# Patient Record
Sex: Female | Born: 1985 | Race: Black or African American | Hispanic: No | Marital: Single | Smoking: Never smoker
Health system: Southern US, Community
[De-identification: ages and names within clinical notes are randomized; demographics above are authoritative.]

## PROBLEM LIST (undated history)

## (undated) DIAGNOSIS — F32A Depression, unspecified: Secondary | ICD-10-CM

## (undated) DIAGNOSIS — R42 Dizziness and giddiness: Secondary | ICD-10-CM

## (undated) DIAGNOSIS — F419 Anxiety disorder, unspecified: Secondary | ICD-10-CM

## (undated) DIAGNOSIS — K219 Gastro-esophageal reflux disease without esophagitis: Secondary | ICD-10-CM

## (undated) DIAGNOSIS — M5430 Sciatica, unspecified side: Secondary | ICD-10-CM

## (undated) DIAGNOSIS — M62838 Other muscle spasm: Secondary | ICD-10-CM

## (undated) HISTORY — DX: Morbid (severe) obesity due to excess calories: E66.01

## (undated) HISTORY — DX: Depression, unspecified: F32.A

---

## 2013-06-28 ENCOUNTER — Ambulatory Visit (INDEPENDENT_AMBULATORY_CARE_PROVIDER_SITE_OTHER): Payer: BC Managed Care – PPO | Admitting: Surgery

## 2013-06-28 ENCOUNTER — Encounter (INDEPENDENT_AMBULATORY_CARE_PROVIDER_SITE_OTHER): Payer: Self-pay | Admitting: Surgery

## 2013-06-28 DIAGNOSIS — E669 Obesity, unspecified: Secondary | ICD-10-CM | POA: Insufficient documentation

## 2013-06-28 NOTE — Progress Notes (Signed)
Chief Complaint:  Morbid obesity BMI is 52  History of Present Illness:  Amanda Ruiz is an 28 y.o. female who was attended our seminar inis interested in a laparoscopic sleeve gastrectomyI reviewed the surgery with the anatomic charts and discussed the complications not limited to bleeding and leaks.    She has tried many diets and attempts at weight loss with limited success.  I think that she is a good candidate for sleeve gastrectomy.  She denies GERD.    Past Medical History  Diagnosis Date  . Morbid obesity     No past surgical history on file.  No current outpatient prescriptions on file.   No current facility-administered medications for this visit.   Review of patient's allergies indicates no known allergies. No family history on file. Social History:   reports that she has never smoked. She does not have any smokeless tobacco history on file. She reports that she drinks alcohol. Her drug history is not on file.   REVIEW OF SYSTEMS : Positive for nothing ; otherwise negative  Physical Exam:   Blood pressure 128/84, pulse 71, temperature 97 F (36.1 C), temperature source Temporal, resp. rate 16, height 5\' 5"  (1.651 m), weight 313 lb 9.6 oz (142.248 kg). Body mass index is 52.19 kg/(m^2).  Gen:  WDWN AAF NAD  Neurological: Alert and oriented to person, place, and time. Motor and sensory function is grossly intact  Head: Normocephalic and atraumatic.  Eyes: Conjunctivae are normal. Pupils are equal, round, and reactive to light. No scleral icterus.  Neck: Normal range of motion. Neck supple. No tracheal deviation or thyromegaly present.  Cardiovascular:  SR without murmurs or gallops.  No carotid bruits Breast:  Not examined Respiratory: Effort normal.  No respiratory distress. No chest wall tenderness. Breath sounds normal.  No wheezes, rales or rhonchi.  Abdomen:  Obese and nontender GU:  Not examined Musculoskeletal: Normal range of motion. Extremities are  nontender. No cyanosis, edema or clubbing noted Lymphadenopathy: No cervical, preauricular, postauricular or axillary adenopathy is present Skin: Skin is warm and dry. No rash noted. No diaphoresis. No erythema. No pallor. Pscyh: Normal mood and affect. Behavior is normal. Judgment and thought content normal.   LABORATORY RESULTS: No results found for this or any previous visit (from the past 48 hour(s)).   RADIOLOGY RESULTS: No results found.  Problem List: There are no active problems to display for this patient.   Assessment & Plan: Morbid obesity  BMI 52 and a good candidate for laparoscopic sleeve gastrectomy    Matt B. Daphine DeutscherMartin, MD, [facility]  [facility] [phone] beeper [phone]  06/28/2013 4:18 PM

## 2013-06-28 NOTE — Addendum Note (Signed)
Addended by: Maryan PulsMOORE, CHRISTY on: 06/28/2013 04:36 PM   Modules accepted: Orders

## 2013-06-28 NOTE — Patient Instructions (Signed)
Sleeve Gastrectomy A sleeve gastrectomy is a surgery in which a large portion of the stomach is removed. After the surgery, the stomach will be a narrow tube about the size of a banana. This surgery is performed to help a person lose weight. The person loses weight because the reduced size of the stomach restricts the amount of food that the person can eat. The stomach will hold much less food than before the surgery. Also, the part of the stomach that is removed produces a hormone that causes hunger.  This surgery is done for people who have morbid obesity, defined as a body mass index (BMI) greater than 40. BMI is an estimate of body fat and is calculated from the height and weight of a person. This surgery may also be done for people with a BMI between 35 and 40 if they have other diseases, such as type 2 diabetes mellitus, obstructive sleep apnea, or heart and lung disorders (cardiopulmonary diseases).  LET YOUR HEALTH CARE PROVIDER KNOW ABOUT:  Any allergies you have.   All medicines you are taking, including vitamins, herbs, eyedrops, creams, and over-the-counter medicines.   Use of steroids (by mouth or creams).   Previous problems you or members of your family have had with the use of anesthetics.   Any blood disorders you have.   Previous surgeries you have had.   Possibility of pregnancy, if this applies.   Other health problems you have. RISKS AND COMPLICATIONS Generally, sleeve gastrectomy is a safe procedure. However, as with any procedure, complications can occur. Possible complications include:  Infection.  Bleeding.  Blood clots.  Damage to other organs or tissue.  Leakage of fluid from the stomach into the abdominal cavity (rare). BEFORE THE PROCEDURE  You may need to have blood tests and imaging tests (such as X-rays or ultrasonography) done before the day of surgery. A test to evaluate your esophagus and how it moves (esophageal manometry) may also be  done.  You may be placed on a liquid diet 2-3 weeks before the surgery.  Ask your health care provider about changing or stopping your regular medicines.  Do not eat or drink anything for at least 8 hours before the procedure.   Make plans to have someone drive you home after your hospital stay. Also arrange for someone to help you with activities during recovery. PROCEDURE  A laparoscopic technique is usually used for this surgery:  You will be given medicine to make you sleep through the procedure (general anesthetic). This medicine will be given through an intravenous (IV) access tube that is put into one of your veins.  Once you are asleep, your abdomen will be cleaned and sterilized.  Several small incisions will be made in your abdomen.  Your abdomen will be filled with air so that it expands. This gives the surgeon more room to operate and makes your organs easier to see.  A thin, lighted tube with a tiny camera on the end (laparoscope) is put through a small incision in your abdomen. The camera on the laparoscope sends a picture to a TV screen in the operating room. This gives the surgeon a good view inside the abdomen.  Hollow tubes are put through the other small incisions in your abdomen. The tools needed for the procedure are put through these tubes.  The surgeon uses staples to divide part of the stomach and then removes it through one of the incisions.  The remaining stomach may be reinforced using stitches   or surgical glue or both to prevent leakage of the stomach contents. A small tube (drain) may be placed through one of the incisions to allow extra fluid to flow from the area.  The incisions are closed with stitches, staples, or glue. AFTER THE PROCEDURE  You will be monitored closely in a recovery area. Once the anesthetic has worn off, you will likely be moved to a regular hospital room.  You will be given medicine for pain and nausea.   You may have a drain  from one of the incisions in your abdomen. If a drain is used, it may stay in place after you go home from the hospital and be removed at a follow-up appointment.   You will be encouraged to walk around several times a day. This helps prevent blood clots.  You will be started on a liquid diet the first day after your surgery. Sometimes a test is done to check for leaking before you can eat.  You will be urged to cough and do deep breathing exercises. This helps prevent a lung infection after a surgery.  You will likely need to stay in the hospital for a few days.  Document Released: 10/17/2008 Document Revised: 08/22/2012 Document Reviewed: 05/04/2012 ExitCare Patient Information 2015 [employer]. This information is not intended to replace advice given to you by your health care provider. Make sure you discuss any questions you have with your health care provider.  

## 2013-07-03 LAB — CBC WITH DIFFERENTIAL/PLATELET
BASOS PCT: 0 % (ref 0–1)
Basophils Absolute: 0 10*3/uL (ref 0.0–0.1)
Eosinophils Absolute: 0.1 10*3/uL (ref 0.0–0.7)
Eosinophils Relative: 1 % (ref 0–5)
HCT: 33.8 % — ABNORMAL LOW (ref 36.0–46.0)
HEMOGLOBIN: 10.8 g/dL — AB (ref 12.0–15.0)
LYMPHS PCT: 36 % (ref 12–46)
Lymphs Abs: 2.1 10*3/uL (ref 0.7–4.0)
MCH: 23.3 pg — ABNORMAL LOW (ref 26.0–34.0)
MCHC: 32 g/dL (ref 30.0–36.0)
MCV: 73 fL — ABNORMAL LOW (ref 78.0–100.0)
Monocytes Absolute: 0.3 10*3/uL (ref 0.1–1.0)
Monocytes Relative: 6 % (ref 3–12)
NEUTROS PCT: 57 % (ref 43–77)
Neutro Abs: 3.2 10*3/uL (ref 1.7–7.7)
Platelets: 287 10*3/uL (ref 150–400)
RBC: 4.63 MIL/uL (ref 3.87–5.11)
RDW: 15.7 % — AB (ref 11.5–15.5)
WBC: 5.7 10*3/uL (ref 4.0–10.5)

## 2013-07-03 LAB — HEMOGLOBIN A1C
Hgb A1c MFr Bld: 6 % — ABNORMAL HIGH (ref <5.7)
Mean Plasma Glucose: 126 mg/dL — ABNORMAL HIGH (ref <117)

## 2013-07-04 LAB — COMPREHENSIVE METABOLIC PANEL
ALK PHOS: 101 U/L (ref 39–117)
ALT: 21 U/L (ref 0–35)
AST: 14 U/L (ref 0–37)
Albumin: 3.8 g/dL (ref 3.5–5.2)
BUN: 11 mg/dL (ref 6–23)
CO2: 23 mEq/L (ref 19–32)
Calcium: 9.2 mg/dL (ref 8.4–10.5)
Chloride: 105 mEq/L (ref 96–112)
Creat: 0.67 mg/dL (ref 0.50–1.10)
Glucose, Bld: 91 mg/dL (ref 70–99)
Potassium: 4.3 mEq/L (ref 3.5–5.3)
SODIUM: 137 meq/L (ref 135–145)
TOTAL PROTEIN: 7.1 g/dL (ref 6.0–8.3)
Total Bilirubin: 0.3 mg/dL (ref 0.2–1.2)

## 2013-07-04 LAB — LIPID PANEL
CHOL/HDL RATIO: 3.3 ratio
Cholesterol: 190 mg/dL (ref 0–200)
HDL: 57 mg/dL (ref >39)
LDL CALC: 116 mg/dL — AB (ref 0–99)
TRIGLYCERIDES: 85 mg/dL (ref <150)
VLDL: 17 mg/dL (ref 0–40)

## 2013-07-04 LAB — T4: T4, Total: 8.8 ug/dL (ref 5.0–12.5)

## 2013-07-04 LAB — TSH: TSH: 2.108 u[IU]/mL (ref 0.350–4.500)

## 2013-07-07 LAB — VITAMIN D 1,25 DIHYDROXY
Vitamin D 1, 25 (OH)2 Total: 43 pg/mL (ref 18–72)
Vitamin D2 1, 25 (OH)2: <8 pg/mL
Vitamin D3 1, 25 (OH)2: 43 pg/mL

## 2013-07-13 ENCOUNTER — Encounter: Payer: BC Managed Care – PPO | Attending: Surgery | Admitting: Dietician

## 2013-07-13 ENCOUNTER — Encounter: Payer: Self-pay | Admitting: Dietician

## 2013-07-13 DIAGNOSIS — Z01818 Encounter for other preprocedural examination: Secondary | ICD-10-CM | POA: Diagnosis not present

## 2013-07-13 DIAGNOSIS — Z713 Dietary counseling and surveillance: Secondary | ICD-10-CM | POA: Diagnosis not present

## 2013-07-13 NOTE — Patient Instructions (Signed)
Follow Pre-Op Goals Try Protein Shakes Call [facility] at [phone] when surgery is scheduled to enroll in Pre-Op Class 

## 2013-07-13 NOTE — Progress Notes (Signed)
  Pre-Op Assessment Visit:  Pre-Operative Sleeve Gastrectomy Surgery  Medical Nutrition Therapy:  Appt start time: 0745   End time:  0815.  Patient was seen on 07/13/2013 for Pre-Operative Sleeve Gastrectomy Nutrition Assessment. [facility] on 07/13/2013.   Preferred Learning Style:   No preference indicated   Learning Readiness:   Ready  Handouts given during visit include:  Pre-Op Goals Bariatric Surgery Protein Shakes  Teaching Method Utilized:  Visual Auditory Hands on  Barriers to learning/adherence to lifestyle change: none  Demonstrated degree of understanding via:  Teach Back   [facility] to [facility] the [facility] to enroll in Pre-Op and Post-Op Nutrition Education when surgery date is scheduled.

## 2013-07-29 ENCOUNTER — Ambulatory Visit (HOSPITAL_COMMUNITY)
Admission: RE | Admit: 2013-07-29 | Discharge: 2013-07-29 | Disposition: A | Discharge status: Home / Self Care | Payer: BC Managed Care – PPO | Source: Ambulatory Visit | Attending: Surgery | Admitting: Surgery

## 2013-07-29 ENCOUNTER — Encounter (INDEPENDENT_AMBULATORY_CARE_PROVIDER_SITE_OTHER): Payer: Self-pay

## 2013-07-29 ENCOUNTER — Encounter (HOSPITAL_COMMUNITY)
Admission: RE | Disposition: A | Discharge status: Home / Self Care | Payer: Self-pay | Source: Ambulatory Visit | Attending: Surgery

## 2013-07-29 ENCOUNTER — Other Ambulatory Visit: Payer: Self-pay

## 2013-07-29 DIAGNOSIS — Z6841 Body Mass Index (BMI) 40.0 and over, adult: Secondary | ICD-10-CM | POA: Insufficient documentation

## 2013-07-29 DIAGNOSIS — Z01818 Encounter for other preprocedural examination: Secondary | ICD-10-CM | POA: Insufficient documentation

## 2013-07-29 HISTORY — PX: BREATH TEK H PYLORI: SHX5422

## 2013-07-29 SURGERY — BREATH TEST, FOR HELICOBACTER PYLORI

## 2013-07-29 NOTE — Progress Notes (Signed)
07/29/13 0817  BREATH TEK ASSESSMENT  Referring MD Luretha MurphyMatthew Martin  Time of Last PO Intake 1900 (on 07/28/2013)  Baseline Breath At: 0752  Pranactin Given At: 0755  Post-Dose Breath At: 0810  Sample 1 2.6  Sample 2 2.5  Test Negative

## 2013-07-30 ENCOUNTER — Encounter (HOSPITAL_COMMUNITY): Payer: Self-pay | Admitting: Surgery

## 2016-04-30 ENCOUNTER — Encounter (HOSPITAL_COMMUNITY): Payer: Self-pay | Admitting: *Deleted

## 2016-04-30 ENCOUNTER — Ambulatory Visit (HOSPITAL_COMMUNITY)
Admission: EM | Admit: 2016-04-30 | Discharge: 2016-04-30 | Disposition: A | Discharge status: Home / Self Care | Payer: BLUE CROSS/BLUE SHIELD | Attending: Family Medicine | Admitting: Family Medicine

## 2016-04-30 DIAGNOSIS — K068 Other specified disorders of gingiva and edentulous alveolar ridge: Secondary | ICD-10-CM | POA: Diagnosis not present

## 2016-04-30 NOTE — Discharge Instructions (Signed)
If your gums start to bleed again, apply direct pressure with gauze.  Try to avoid things like aspirin, ibuprofen, Motrin, Aleve, Advil.  You could consider taking one vitamin K supplement daily. This may be of limited benefit.  Try to avoid physically irritating foods that could cause your gums to bleed again.

## 2016-04-30 NOTE — ED Triage Notes (Signed)
Reports having "deep dental cleaning" performed at dentist office 2 days ago; had some gum bleeding during.  Since then, has had intermittent signficant bleeding on right bottom gum surrounding tooth.

## 2016-04-30 NOTE — ED Provider Notes (Signed)
[facility]    CSN: [id number] Arrival date & time: 04/30/16  1714     History   Chief Complaint Chief Complaint  Patient presents with  . Bleeding/Bruising    HPI Amanda Ruiz is a 31 y.o. female.   HPI  The patient was at her dentist's office 2 days ago and had a deep cleaning. She noticed some bleeding gums over the lower row of teeth after the appointment. It has continued to bleed over the past couple days. It bothers her most at night. She does not have any foul or strange tastes other than blood. No fevers and she is no longer having any pain. She denies any personal or family history of easy bruising or bleeding. She does not take any aspirin or anti-inflammatories routinely. She did take Tylenol for pain.  Past Medical History:  Diagnosis Date  . Morbid obesity [facility])     Patient Active Problem List   Diagnosis Date Noted  . Morbid obesity (HCC) 06/28/2013    Past Surgical History:  Procedure Laterality Date  . BREATH TEK H PYLORI N/A 07/29/2013   Procedure: BREATH TEK H PYLORI;  Surgeon: Valarie Merino, MD;  Location: [facility];  Service: General;  Laterality: N/A;    Home Medications    Prior to Admission medications   Medication Sig Start Date End Date Taking? Authorizing Provider  Levonorgestrel (SKYLA) 13.5 MG IUD by Intrauterine route.   Yes Historical Provider, MD    Family History Family History  Problem Relation Age of Onset  . Hypertension Other     Social History Social History  Substance Use Topics  . Smoking status: Never Smoker  . Alcohol use Yes     Comment: occasionally     Allergies   Patient has no known allergies.   Review of Systems Review of Systems  HENT:       + Bleeding gums     Physical Exam Triage Vital Signs ED Triage Vitals [04/30/16 1741]  Enc Vitals Group     BP 134/68     Pulse Rate (!) 104     Resp 18     Temp 98 F (36.7 C)     Temp Source Oral     SpO2 100 %   Updated Vital  Signs BP 134/68   Pulse (!) 104   Temp 98 F (36.7 C) (Oral)   Resp 18   SpO2 100%   Physical Exam  Constitutional: She is oriented to person, place, and time. She appears well-developed and well-nourished.  HENT:  Head: Normocephalic and atraumatic.  Mouth/Throat: Oropharynx is clear and moist.  Coagulated blood around the root of the 1st premolar of R mandibular row. No swelling, erythema or drainage.  Eyes: Pupils are equal, round, and reactive to light.  Neck: Normal range of motion. Neck supple.  Cardiovascular: Normal rate and regular rhythm.   Pulmonary/Chest: Effort normal and breath sounds normal.  Neurological: She is alert and oriented to person, place, and time.  Skin: Skin is warm and dry. She is not diaphoretic.  Psychiatric: She has a normal mood and affect. Judgment normal.     UC Treatments / Results  Procedures Procedures none  Initial Impression / Assessment and Plan / UC Course  I have reviewed the triage vital signs and the nursing notes.  Pertinent labs & imaging results that were available during my care of the patient were reviewed by me and considered in my medical decision making (  see chart for details).     Patient presents with bleeding gums secondary to trauma from dental procedure. She did call her dentist after hours service, however is not her to call back yet. Advised that she avoid NSAIDs. Try to avoid trauma inducing foods. Seek care she starts to have fevers, worsening pain, bleeding that will not stop, strange tastes/orders, or worsening swelling. Suggested Vit K supplement daily until she can be seen. Follow-up with dentist on Monday if symptoms fail to improve. The patient voiced understanding and agreement to the plan.  Final Clinical Impressions(s) / UC Diagnoses   Final diagnoses:  Bleeding gums     Jilda Roche [place], DO 04/30/16 1815

## 2017-02-09 DIAGNOSIS — Z13220 Encounter for screening for lipoid disorders: Secondary | ICD-10-CM | POA: Diagnosis not present

## 2017-02-09 DIAGNOSIS — Z0000 Encounter for general adult medical examination without abnormal findings: Secondary | ICD-10-CM | POA: Diagnosis not present

## 2017-02-09 DIAGNOSIS — Z131 Encounter for screening for diabetes mellitus: Secondary | ICD-10-CM | POA: Diagnosis not present

## 2017-02-09 DIAGNOSIS — Z6841 Body Mass Index (BMI) 40.0 and over, adult: Secondary | ICD-10-CM | POA: Diagnosis not present

## 2017-02-09 DIAGNOSIS — Z23 Encounter for immunization: Secondary | ICD-10-CM | POA: Diagnosis not present

## 2017-02-21 DIAGNOSIS — E8881 Metabolic syndrome: Secondary | ICD-10-CM | POA: Diagnosis not present

## 2017-02-21 DIAGNOSIS — Z6841 Body Mass Index (BMI) 40.0 and over, adult: Secondary | ICD-10-CM | POA: Diagnosis not present

## 2017-02-21 DIAGNOSIS — R7303 Prediabetes: Secondary | ICD-10-CM | POA: Diagnosis not present

## 2017-05-01 DIAGNOSIS — M5442 Lumbago with sciatica, left side: Secondary | ICD-10-CM | POA: Diagnosis not present

## 2017-05-01 DIAGNOSIS — R030 Elevated blood-pressure reading, without diagnosis of hypertension: Secondary | ICD-10-CM | POA: Diagnosis not present

## 2017-05-01 DIAGNOSIS — Z6841 Body Mass Index (BMI) 40.0 and over, adult: Secondary | ICD-10-CM | POA: Diagnosis not present

## 2017-06-02 DIAGNOSIS — M5442 Lumbago with sciatica, left side: Secondary | ICD-10-CM | POA: Diagnosis not present

## 2017-06-02 DIAGNOSIS — M5386 Other specified dorsopathies, lumbar region: Secondary | ICD-10-CM | POA: Diagnosis not present

## 2017-06-02 DIAGNOSIS — M9903 Segmental and somatic dysfunction of lumbar region: Secondary | ICD-10-CM | POA: Diagnosis not present

## 2017-06-02 DIAGNOSIS — M9905 Segmental and somatic dysfunction of pelvic region: Secondary | ICD-10-CM | POA: Diagnosis not present

## 2017-06-05 DIAGNOSIS — M5442 Lumbago with sciatica, left side: Secondary | ICD-10-CM | POA: Diagnosis not present

## 2017-06-05 DIAGNOSIS — M9905 Segmental and somatic dysfunction of pelvic region: Secondary | ICD-10-CM | POA: Diagnosis not present

## 2017-06-05 DIAGNOSIS — M9903 Segmental and somatic dysfunction of lumbar region: Secondary | ICD-10-CM | POA: Diagnosis not present

## 2017-06-05 DIAGNOSIS — M5386 Other specified dorsopathies, lumbar region: Secondary | ICD-10-CM | POA: Diagnosis not present

## 2017-06-08 DIAGNOSIS — M9905 Segmental and somatic dysfunction of pelvic region: Secondary | ICD-10-CM | POA: Diagnosis not present

## 2017-06-08 DIAGNOSIS — M9903 Segmental and somatic dysfunction of lumbar region: Secondary | ICD-10-CM | POA: Diagnosis not present

## 2017-06-08 DIAGNOSIS — M5442 Lumbago with sciatica, left side: Secondary | ICD-10-CM | POA: Diagnosis not present

## 2017-06-08 DIAGNOSIS — M5386 Other specified dorsopathies, lumbar region: Secondary | ICD-10-CM | POA: Diagnosis not present

## 2017-06-13 DIAGNOSIS — M5386 Other specified dorsopathies, lumbar region: Secondary | ICD-10-CM | POA: Diagnosis not present

## 2017-06-13 DIAGNOSIS — M5442 Lumbago with sciatica, left side: Secondary | ICD-10-CM | POA: Diagnosis not present

## 2017-06-13 DIAGNOSIS — M9903 Segmental and somatic dysfunction of lumbar region: Secondary | ICD-10-CM | POA: Diagnosis not present

## 2017-06-13 DIAGNOSIS — M9905 Segmental and somatic dysfunction of pelvic region: Secondary | ICD-10-CM | POA: Diagnosis not present

## 2017-06-20 DIAGNOSIS — H8110 Benign paroxysmal vertigo, unspecified ear: Secondary | ICD-10-CM | POA: Diagnosis not present

## 2017-06-22 DIAGNOSIS — M9903 Segmental and somatic dysfunction of lumbar region: Secondary | ICD-10-CM | POA: Diagnosis not present

## 2017-06-22 DIAGNOSIS — M5386 Other specified dorsopathies, lumbar region: Secondary | ICD-10-CM | POA: Diagnosis not present

## 2017-06-22 DIAGNOSIS — M5442 Lumbago with sciatica, left side: Secondary | ICD-10-CM | POA: Diagnosis not present

## 2017-06-22 DIAGNOSIS — M9905 Segmental and somatic dysfunction of pelvic region: Secondary | ICD-10-CM | POA: Diagnosis not present

## 2018-02-04 DIAGNOSIS — N771 Vaginitis, vulvitis and vulvovaginitis in diseases classified elsewhere: Secondary | ICD-10-CM | POA: Diagnosis not present

## 2018-02-28 DIAGNOSIS — R5383 Other fatigue: Secondary | ICD-10-CM | POA: Diagnosis not present

## 2018-03-01 ENCOUNTER — Other Ambulatory Visit (HOSPITAL_COMMUNITY): Payer: Self-pay | Admitting: General Surgery

## 2018-03-01 ENCOUNTER — Other Ambulatory Visit: Payer: Self-pay | Admitting: General Surgery

## 2018-03-09 DIAGNOSIS — Z113 Encounter for screening for infections with a predominantly sexual mode of transmission: Secondary | ICD-10-CM | POA: Diagnosis not present

## 2018-03-09 DIAGNOSIS — Z6841 Body Mass Index (BMI) 40.0 and over, adult: Secondary | ICD-10-CM | POA: Diagnosis not present

## 2018-03-09 DIAGNOSIS — Z1151 Encounter for screening for human papillomavirus (HPV): Secondary | ICD-10-CM | POA: Diagnosis not present

## 2018-03-09 DIAGNOSIS — Z01419 Encounter for gynecological examination (general) (routine) without abnormal findings: Secondary | ICD-10-CM | POA: Diagnosis not present

## 2018-03-15 ENCOUNTER — Encounter: Payer: BLUE CROSS/BLUE SHIELD | Attending: General Surgery | Admitting: Dietician

## 2018-03-15 ENCOUNTER — Other Ambulatory Visit: Payer: Self-pay

## 2018-03-15 ENCOUNTER — Encounter: Payer: Self-pay | Admitting: Dietician

## 2018-03-15 VITALS — Ht 65.0 in | Wt 325.8 lb

## 2018-03-15 DIAGNOSIS — E669 Obesity, unspecified: Secondary | ICD-10-CM | POA: Insufficient documentation

## 2018-03-15 NOTE — Progress Notes (Signed)
Bariatric Pre-Op Nutrition Assessment Medical Nutrition Therapy  Appt Start Time: 8:45am  End time: 9:30am  Patient was seen on 03/15/2018 for Pre-Operative Nutrition Assessment. Assessment and letter of approval faxed to [facility] coordinator on 03/15/2018.   Planned surgery: Sleeve Gastrectomy Pt expectation of surgery: a tool to further the work she is already doing through diet and exercise, to maintain any weight loss Pt expectation of dietitian: finding foods that are supportive and fitting  Anthropometrics  Start weight at [facility]: 325.8 lbs (date: 03/15/2018) Height: 65 in BMI: 54.22 kg/m2    Clinical  Medical Hx: obesity Surgeries: N/A Medications: N/A Allergies: N/A  Psychosocial/Lifestyle Pt lives with her dog. Works at [employer] as an Web designer. Pt states she knows other people who have had the surgery. Pt is kind and is motivated for surgery.   24-Hr Dietary Recall First Meal: Starbucks chai tea latte + ham & cheese croissant  Snack: chili lime cashews (or lite cheddar popcorn)  Second Meal: Caesar salad (or chicken wings + vegetable)  Snack: chocolate (or hummus + cucumbers)  Third Meal: baked/air fried meat (chicken/fish/shrimp/turkey) + vegetable Snack: none Beverages: water, tea, coffee, Sprite   Food & Nutrition Related Hx Dietary Hx: pt states she has attempted healthy eating in the past and overall does well with making good food choices, but believes she eats too large of portions. Pt states candy, especially chocolate, is her weakness. Likes meat and will prepare by baking or air frying. Also likes bacon/pork. Sometimes skips breakfast. Pt states she tries to meal prep to avoid eating out and to save money.  Estimated Daily Fluid Intake: 64+ oz Supplements: MVI + magnesium  GI / Other Notable Symptoms: none stated   Physical Activity  Current average weekly physical activity: walking the dog,  yoga  Estimated Energy Needs Calories: 1800 Carbohydrate: 200g Protein: 135g Fat: 50g  Pre-Op Goals Reviewed with the Patient . Track food and beverage intake (try MyFitness Pal or the Baritastic app) . Make healthy food choices while monitoring portion sizes . Avoid concentrated sugars and fried foods . Keep fat & sugar in the single digits per serving on food labels . Practice CHEWING your food (aim for applesauce consistency) . Practice not drinking 15 minutes before, during, and 30 minutes after each meal and snack . Avoid all carbonated beverages (ex: soda, sparkling beverages)  . Limit caffeinated beverages (ex: coffee, tea, energy drinks) . Avoid all sugar-sweetened beverages (ex: regular soda, sports drinks)  . Avoid alcohol  . Consume 3 meals per day or try to eat every 3-5 hours . Make a list of non-food related activities . Aim for 64-100 ounces of FLUID daily (with at least half of fluid intake being plain water)  . Aim for at least 60-80 grams of PROTEIN daily . Look for a liquid protein source that contains ?15 g protein and ?5 g carbohydrate (ex: shakes, drinks, shots) . Physical activity is an important part of a healthy lifestyle so keep it moving! The goal is to reach 150 minutes of exercise per week, including cardiovascular and weight baring activity.  Handouts Provided Include  . Bariatric Surgery handouts (Nutrition Visits, Pre-Op Goals, Protein Shakes, Vitamins & Minerals, Support Group 2020 Schedule)   Learning Style & Readiness for Change Teaching method utilized: Visual & Auditory  Demonstrated degree of understanding via: Teach Back  Barriers to learning/adherence to lifestyle change: None Identified  Next Steps Supervised Weight Loss (SWL) Visits Needed:  0  Patient is to call [facility] to enroll in Pre-Op Class (>2 weeks before surgery) and Post-Op Class (2 weeks after surgery) for further nutrition education when surgery date is scheduled.

## 2018-03-16 ENCOUNTER — Ambulatory Visit (HOSPITAL_COMMUNITY)
Admission: RE | Admit: 2018-03-16 | Discharge: 2018-03-16 | Disposition: A | Discharge status: Home / Self Care | Payer: BLUE CROSS/BLUE SHIELD | Source: Ambulatory Visit | Attending: General Surgery | Admitting: General Surgery

## 2018-03-16 DIAGNOSIS — Z01818 Encounter for other preprocedural examination: Secondary | ICD-10-CM | POA: Diagnosis not present

## 2018-03-16 DIAGNOSIS — I4510 Unspecified right bundle-branch block: Secondary | ICD-10-CM

## 2018-03-16 DIAGNOSIS — K224 Dyskinesia of esophagus: Secondary | ICD-10-CM | POA: Diagnosis not present

## 2018-03-16 HISTORY — DX: Unspecified right bundle-branch block: I45.10

## 2018-04-12 DIAGNOSIS — L84 Corns and callosities: Secondary | ICD-10-CM | POA: Diagnosis not present

## 2018-04-12 DIAGNOSIS — Z6841 Body Mass Index (BMI) 40.0 and over, adult: Secondary | ICD-10-CM | POA: Diagnosis not present

## 2018-08-04 DIAGNOSIS — F509 Eating disorder, unspecified: Secondary | ICD-10-CM | POA: Diagnosis not present

## 2018-11-19 ENCOUNTER — Other Ambulatory Visit: Payer: Self-pay

## 2018-11-19 ENCOUNTER — Encounter: Payer: BC Managed Care – PPO | Attending: General Surgery | Admitting: Skilled Nursing Facility1

## 2018-11-19 DIAGNOSIS — E669 Obesity, unspecified: Secondary | ICD-10-CM | POA: Insufficient documentation

## 2018-11-19 NOTE — Progress Notes (Signed)
Pre-Operative Nutrition Class:  Appt start time: 4765   End time:  1830.  Patient was seen on 11/19/2018 for Pre-Operative Bariatric Surgery Education at [facility].   Surgery date:  Surgery type: sleeve Start weight at [facility]: 325.8 Weight today: 316  Samples given per MNT protocol. Patient educated on appropriate usage: Celebrate calcium Lot: [id number] Exp 11/21 The following the learning objectives were met by the patient during this course:  Identify Pre-Op Dietary Goals and will begin 2 weeks pre-operatively  Identify appropriate sources of fluids and proteins   State protein recommendations and appropriate sources pre and post-operatively  Identify Post-Operative Dietary Goals and will follow for 2 weeks post-operatively  Identify appropriate multivitamin and calcium sources  Describe the need for physical activity post-operatively and will follow MD recommendations  State when to call healthcare provider regarding medication questions or post-operative complications  Handouts given during class include:  Pre-Op Bariatric Surgery Diet Handout  Protein Shake Handout  Post-Op Bariatric Surgery Nutrition Handout  [facility] Program Information Flyer  Support Group Information Flyer  [facility] Bariatric Supplements Price List  Follow-Up Plan: Patient will follow-up at [facility] 2 weeks post operatively for diet advancement per MD.

## 2018-11-23 ENCOUNTER — Ambulatory Visit: Payer: Self-pay | Admitting: General Surgery

## 2018-12-12 ENCOUNTER — Encounter (HOSPITAL_COMMUNITY): Payer: Self-pay

## 2018-12-12 NOTE — Patient Instructions (Signed)
DUE TO COVID-19 ONLY ONE VISITOR IS ALLOWED TO COME WITH YOU AND STAY IN THE WAITING ROOM ONLY DURING PRE OP AND PROCEDURE. THE ONE VISITOR MAY VISIT WITH YOU IN YOUR PRIVATE ROOM DURING VISITING HOURS ONLY!!   COVID SWAB TESTING MUST BE COMPLETED ON: Today, Immediately after pre op appointment   [address], [place] -[facility] enter pre surgical testing line (Must self quarantine after testing. Follow instructions on handout.)             Your procedure is scheduled on: Tuesday, Dec. 15, 2020   Report to [facility] Main  Entrance    Report to admitting at 11:00 AM   Call this number if you have problems the morning of surgery [phone]   NO SOLID FOOD AFTER 600 PM THE NIGHT BEFORE YOUR SURGERY.    YOU MAY DRINK CLEAR FLUIDS UNTIL 10:00 AM DAY OF SURGERY   CLEAR LIQUID DIET  Foods Allowed                                                                     Foods Excluded  Water, Black Coffee and tea, regular and decaf                             liquids that you cannot  Plain Jell-O in any flavor  (No red)                                           see through such as: Fruit ices (not with fruit pulp)                                     milk, soups, orange juice  Iced Popsicles (No red)                                    All solid food Carbonated beverages, regular and diet                                    Apple juices Sports drinks like Gatorade (No red) Lightly seasoned clear broth or consume(fat free) Sugar, honey syrup  Sample Menu Breakfast                                Lunch                                     Supper Cranberry juice                    Beef broth  Chicken broth Jell-O                                     Grape juice                           Apple juice Coffee or tea                        Jell-O                                      Popsicle  Coffee or tea                        Coffee or tea    MORNING OF SURGERY DRINK:   DRINK 1 G2 drink BEFORE YOU LEAVE HOME, DRINK ALL OF THE  G2 DRINK AT ONE TIME.    Brush your teeth the morning of surgery.   Do NOT smoke after Midnight   Take these medicines the morning of surgery with A SIP OF WATER: NONW                               You may not have any metal on your body including hair pins, jewelry, and body piercings             Do not wear make-up, lotions, powders, perfumes/cologne, or deodorant             Do not wear nail polish.  Do not shave  48 hours prior to surgery.               Do not bring valuables to the hospital. [place] IS NOT             RESPONSIBLE   FOR VALUABLES.   Contacts, dentures or bridgework may not be worn into surgery.   Bring small overnight bag day of surgery.    PAIN IS EXPECTED AFTER SURGERY AND WILL NOT BE COMPLETELY ELIMINATED. AMBULATION AND TYLENOL WILL HELP REDUCE INCISIONAL AND GAS PAIN. MOVEMENT IS KEY!   YOU ARE EXPECTED TO BE OUT OF BED WITHIN 4 HOURS OF ADMISSION TO YOUR PATIENT ROOM.   SITTING IN THE RECLINER THROUGHOUT THE DAY IS IMPORTANT FOR DRINKING FLUIDS AND MOVING GAS THROUGHOUT THE GI TRACT.   COMPRESSION STOCKINGS SHOULD BE WORN [facility] STAY UNLESS YOU ARE WALKING.    INCENTIVE SPIROMETER SHOULD BE USED EVERY HOUR WHILE AWAKE TO DECREASE POST-OPERATIVE COMPLICATIONS SUCH AS PNEUMONIA.   WHEN DISCHARGED HOME, IT IS IMPORTANT TO CONTINUE TO WALK EVERY HOUR AND USE THE INCENTIVE SPIROMETER EVERY HOUR.    Special Instructions: Bring a copy of your healthcare power of attorney and living will documents         the day of surgery if you haven't scanned them in before.              Please read over the following fact sheets you were given:  [facility] - Preparing for Surgery Before surgery, you can play an important role.  Because skin is not sterile, your skin needs to be as free of germs as possible.  You  can reduce the  number of germs on your skin by washing with CHG (chlorahexidine gluconate) soap before surgery.  CHG is an antiseptic cleaner which kills germs and bonds with the skin to continue killing germs even after washing. Please DO NOT use if you have an allergy to CHG or antibacterial soaps.  If your skin becomes reddened/irritated stop using the CHG and inform your nurse when you arrive at Short Stay. Do not shave (including legs and underarms) for at least 48 hours prior to the first CHG shower.  You may shave your face/neck.  Please follow these instructions carefully:  1.  Shower with CHG Soap the night before surgery and the  morning of surgery.  2.  If you choose to wash your hair, wash your hair first as usual with your normal  shampoo.  3.  After you shampoo, rinse your hair and body thoroughly to remove the shampoo.                             4.  Use CHG as you would any other liquid soap.  You can apply chg directly to the skin and wash.  Gently with a scrungie or clean washcloth.  5.  Apply the CHG Soap to your body ONLY FROM THE NECK DOWN.   Do   not use on face/ open                           Wound or open sores. Avoid contact with eyes, ears mouth and   genitals (private parts).                       Wash face,  Genitals (private parts) with your normal soap.             6.  Wash thoroughly, paying special attention to the area where your    surgery  will be performed.  7.  Thoroughly rinse your body with warm water from the neck down.  8.  DO NOT shower/wash with your normal soap after using and rinsing off the CHG Soap.                9.  Pat yourself dry with a clean towel.            10.  Wear clean pajamas.            11.  Place clean sheets on your bed the night of your first shower and do not  sleep with pets. Day of Surgery : Do not apply any lotions/deodorants the morning of surgery.  Please wear clean clothes to the hospital/surgery center.  FAILURE TO FOLLOW  THESE INSTRUCTIONS MAY RESULT IN THE CANCELLATION OF YOUR SURGERY  PATIENT SIGNATURE_________________________________  NURSE SIGNATURE__________________________________  ________________________________________________________________________   Amanda Ruiz  An incentive spirometer is a tool that can help keep your lungs clear and active. This tool measures how well you are filling your lungs with each breath. Taking long deep breaths may help reverse or decrease the chance of developing breathing (pulmonary) problems (especially infection) following:  A long period of time when you are unable to move or be active. BEFORE THE PROCEDURE   If the spirometer includes an indicator to show your best effort, your nurse or respiratory therapist will set it to a desired goal.  If possible, sit up straight or lean slightly forward. Try not to  slouch.  Hold the incentive spirometer in an upright position. INSTRUCTIONS FOR USE  1. Sit on the edge of your bed if possible, or sit up as far as you can in bed or on a chair. 2. Hold the incentive spirometer in an upright position. 3. Breathe out normally. 4. Place the mouthpiece in your mouth and seal your lips tightly around it. 5. Breathe in slowly and as deeply as possible, raising the piston or the ball toward the top of the column. 6. Hold your breath for 3-5 seconds or for as long as possible. Allow the piston or ball to fall to the bottom of the column. 7. Remove the mouthpiece from your mouth and breathe out normally. 8. Rest for a few seconds and repeat Steps 1 through 7 at least 10 times every 1-2 hours when you are awake. Take your time and take a few normal breaths between deep breaths. 9. The spirometer may include an indicator to show your best effort. Use the indicator as a goal to work toward during each repetition. 10. After each set of 10 deep breaths, practice coughing to be sure your lungs are clear. If you have an incision  (the cut made at the time of surgery), support your incision when coughing by placing a pillow or rolled up towels firmly against it. Once you are able to get out of bed, walk around indoors and cough well. You may stop using the incentive spirometer when instructed by your caregiver.  RISKS AND COMPLICATIONS  Take your time so you do not get dizzy or light-headed.  If you are in pain, you may need to take or ask for pain medication before doing incentive spirometry. It is harder to take a deep breath if you are having pain. AFTER USE  Rest and breathe slowly and easily.  It can be helpful to keep track of a log of your progress. Your caregiver can provide you with a simple table to help with this. If you are using the spirometer at home, follow these instructions: SEEK MEDICAL CARE IF:   You are having difficultly using the spirometer.  You have trouble using the spirometer as often as instructed.  Your pain medication is not giving enough relief while using the spirometer.  You develop fever of 100.5 F (38.1 C) or higher. SEEK IMMEDIATE MEDICAL CARE IF:   You cough up bloody sputum that had not been present before.  You develop fever of 102 F (38.9 C) or greater.  You develop worsening pain at or near the incision site. MAKE SURE YOU:   Understand these instructions.  Will watch your condition.  Will get help right away if you are not doing well or get worse. Document Released: 05/02/2006 Document Revised: 03/14/2011 Document Reviewed: 07/03/2006 ExitCare Patient Information 2014 [employer], [place].   ________________________________________________________________________  WHAT IS A BLOOD TRANSFUSION? Blood Transfusion Information  A transfusion is the replacement of blood or some of its parts. Blood is made up of multiple cells which provide different functions.  Red blood cells carry oxygen and are used for blood loss replacement.  White blood cells fight against  infection.  Platelets control bleeding.  Plasma helps clot blood.  Other blood products are available for specialized needs, such as hemophilia or other clotting disorders. BEFORE THE TRANSFUSION  Who gives blood for transfusions?   Healthy volunteers who are fully evaluated to make sure their blood is safe. This is blood bank blood. Transfusion therapy is the safest it  has ever been in the practice of medicine. Before blood is taken from a donor, a complete history is taken to make sure that person has no history of diseases nor engages in risky social behavior (examples are intravenous drug use or sexual activity with multiple partners). The donor's travel history is screened to minimize risk of transmitting infections, such as malaria. The donated blood is tested for signs of infectious diseases, such as HIV and hepatitis. The blood is then tested to be sure it is compatible with you in order to minimize the chance of a transfusion reaction. If you or a relative donates blood, this is often done in anticipation of surgery and is not appropriate for emergency situations. It takes many days to process the donated blood. RISKS AND COMPLICATIONS Although transfusion therapy is very safe and saves many lives, the main dangers of transfusion include:   Getting an infectious disease.  Developing a transfusion reaction. This is an allergic reaction to something in the blood you were given. Every precaution is taken to prevent this. The decision to have a blood transfusion has been considered carefully by your caregiver before blood is given. Blood is not given unless the benefits outweigh the risks. AFTER THE TRANSFUSION  Right after receiving a blood transfusion, you will usually feel much better and more energetic. This is especially true if your red blood cells have gotten low (anemic). The transfusion raises the level of the red blood cells which carry oxygen, and this usually causes an energy  increase.  The nurse administering the transfusion will monitor you carefully for complications. HOME CARE INSTRUCTIONS  No special instructions are needed after a transfusion. You may find your energy is better. Speak with your caregiver about any limitations on activity for underlying diseases you may have. SEEK MEDICAL CARE IF:   Your condition is not improving after your transfusion.  You develop redness or irritation at the intravenous (IV) site. SEEK IMMEDIATE MEDICAL CARE IF:  Any of the following symptoms occur over the next 12 hours:  Shaking chills.  You have a temperature by mouth above 102 F (38.9 C), not controlled by medicine.  Chest, back, or muscle pain.  People around you feel you are not acting correctly or are confused.  Shortness of breath or difficulty breathing.  Dizziness and fainting.  You get a rash or develop hives.  You have a decrease in urine output.  Your urine turns a dark color or changes to pink, red, or brown. Any of the following symptoms occur over the next 10 days:  You have a temperature by mouth above 102 F (38.9 C), not controlled by medicine.  Shortness of breath.  Weakness after normal activity.  The white part of the eye turns yellow (jaundice).  You have a decrease in the amount of urine or are urinating less often.  Your urine turns a dark color or changes to pink, red, or brown. Document Released: 12/18/1999 Document Revised: 03/14/2011 Document Reviewed: 08/06/2007 [facility] Patient Information 2014 [place], [place].  _______________________________________________________________________

## 2018-12-14 ENCOUNTER — Encounter (HOSPITAL_COMMUNITY): Payer: Self-pay

## 2018-12-14 ENCOUNTER — Other Ambulatory Visit (HOSPITAL_COMMUNITY)
Admission: RE | Admit: 2018-12-14 | Discharge: 2018-12-14 | Disposition: A | Discharge status: Home / Self Care | Payer: BC Managed Care – PPO | Source: Ambulatory Visit | Attending: General Surgery | Admitting: General Surgery

## 2018-12-14 ENCOUNTER — Encounter (HOSPITAL_COMMUNITY)
Admission: RE | Admit: 2018-12-14 | Discharge: 2018-12-14 | Disposition: A | Discharge status: Home / Self Care | Payer: BC Managed Care – PPO | Source: Ambulatory Visit | Attending: General Surgery | Admitting: General Surgery

## 2018-12-14 ENCOUNTER — Other Ambulatory Visit: Payer: Self-pay

## 2018-12-14 DIAGNOSIS — R5383 Other fatigue: Secondary | ICD-10-CM | POA: Diagnosis not present

## 2018-12-14 DIAGNOSIS — Z01812 Encounter for preprocedural laboratory examination: Secondary | ICD-10-CM | POA: Insufficient documentation

## 2018-12-14 DIAGNOSIS — Z6841 Body Mass Index (BMI) 40.0 and over, adult: Secondary | ICD-10-CM | POA: Insufficient documentation

## 2018-12-14 DIAGNOSIS — Z20828 Contact with and (suspected) exposure to other viral communicable diseases: Secondary | ICD-10-CM | POA: Diagnosis not present

## 2018-12-14 HISTORY — DX: Anxiety disorder, unspecified: F41.9

## 2018-12-14 HISTORY — DX: Gastro-esophageal reflux disease without esophagitis: K21.9

## 2018-12-14 HISTORY — DX: Sciatica, unspecified side: M54.30

## 2018-12-14 HISTORY — DX: Dizziness and giddiness: R42

## 2018-12-14 HISTORY — DX: Other muscle spasm: M62.838

## 2018-12-14 LAB — COMPREHENSIVE METABOLIC PANEL
ALT: 19 U/L (ref 0–44)
AST: 14 U/L — ABNORMAL LOW (ref 15–41)
Albumin: 3.9 g/dL (ref 3.5–5.0)
Alkaline Phosphatase: 82 U/L (ref 38–126)
Anion gap: 10 (ref 5–15)
BUN: 12 mg/dL (ref 6–20)
CO2: 23 mmol/L (ref 22–32)
Calcium: 9.4 mg/dL (ref 8.9–10.3)
Chloride: 106 mmol/L (ref 98–111)
Creatinine, Ser: 0.72 mg/dL (ref 0.44–1.00)
GFR calc Af Amer: >60 mL/min (ref >60)
GFR calc non Af Amer: >60 mL/min (ref >60)
Glucose, Bld: 77 mg/dL (ref 70–99)
Potassium: 3.8 mmol/L (ref 3.5–5.1)
Sodium: 139 mmol/L (ref 135–145)
Total Bilirubin: 0.4 mg/dL (ref 0.3–1.2)
Total Protein: 8 g/dL (ref 6.5–8.1)

## 2018-12-14 LAB — CBC WITH DIFFERENTIAL/PLATELET
Abs Immature Granulocytes: 0.03 10*3/uL (ref 0.00–0.07)
Basophils Absolute: 0 10*3/uL (ref 0.0–0.1)
Basophils Relative: 0 %
Eosinophils Absolute: 0.1 10*3/uL (ref 0.0–0.5)
Eosinophils Relative: 1 %
HCT: 38 % (ref 36.0–46.0)
Hemoglobin: 11.8 g/dL — ABNORMAL LOW (ref 12.0–15.0)
Immature Granulocytes: 0 %
Lymphocytes Relative: 27 %
Lymphs Abs: 2.2 10*3/uL (ref 0.7–4.0)
MCH: 24.9 pg — ABNORMAL LOW (ref 26.0–34.0)
MCHC: 31.1 g/dL (ref 30.0–36.0)
MCV: 80.3 fL (ref 80.0–100.0)
Monocytes Absolute: 0.4 10*3/uL (ref 0.1–1.0)
Monocytes Relative: 5 %
Neutro Abs: 5.5 10*3/uL (ref 1.7–7.7)
Neutrophils Relative %: 67 %
Platelets: 323 10*3/uL (ref 150–400)
RBC: 4.73 MIL/uL (ref 3.87–5.11)
RDW: 15 % (ref 11.5–15.5)
WBC: 8.2 10*3/uL (ref 4.0–10.5)
nRBC: 0 % (ref 0.0–0.2)

## 2018-12-14 NOTE — Progress Notes (Signed)
PCP - N/A Cardiologist - N/A  Chest x-ray - 03/15/2018 in epic EKG - 03/16/2018 in epic Stress Test - N/A ECHO - N/A Cardiac Cath - N/A  Sleep Study - while in middle school CPAP - N/A  Fasting Blood Sugar - N/A Checks Blood Sugar __N/A___ times a day  Blood Thinner Instructions:  N/A Aspirin Instructions:  N/A Last Dose:  N/A  Anesthesia review:   N/A  Patient denies shortness of breath, fever, cough and chest pain at PAT appointment   Patient verbalized understanding of instructions that were given to them at the PAT appointment. Patient was also instructed that they will need to review over the PAT instructions again at home before surgery.

## 2018-12-15 LAB — ABO/RH: ABO/RH(D): B POS

## 2018-12-15 LAB — NOVEL CORONAVIRUS, NAA (HOSP ORDER, SEND-OUT TO REF LAB; TAT 18-24 HRS): SARS-CoV-2, NAA: NOT DETECTED

## 2018-12-17 ENCOUNTER — Encounter (HOSPITAL_COMMUNITY): Payer: Self-pay | Admitting: General Surgery

## 2018-12-17 MED ORDER — BUPIVACAINE LIPOSOME 1.3 % IJ SUSP
20.0000 mL | Freq: Once | INTRAMUSCULAR | Status: DC
  Filled 2018-12-17: qty 20

## 2018-12-18 ENCOUNTER — Encounter (HOSPITAL_COMMUNITY)
Admission: RE | Disposition: A | Discharge status: Home / Self Care | Payer: Self-pay | Source: Home / Self Care | Attending: General Surgery

## 2018-12-18 ENCOUNTER — Encounter (HOSPITAL_COMMUNITY): Payer: Self-pay | Admitting: General Surgery

## 2018-12-18 ENCOUNTER — Inpatient Hospital Stay (HOSPITAL_COMMUNITY): Payer: BC Managed Care – PPO | Admitting: Physician Assistant

## 2018-12-18 ENCOUNTER — Inpatient Hospital Stay (HOSPITAL_COMMUNITY): Payer: BC Managed Care – PPO | Admitting: Anesthesiology

## 2018-12-18 ENCOUNTER — Other Ambulatory Visit: Payer: Self-pay

## 2018-12-18 ENCOUNTER — Inpatient Hospital Stay (HOSPITAL_COMMUNITY)
Admission: RE | Admit: 2018-12-18 | Discharge: 2018-12-19 | DRG: 621 | Disposition: A | Discharge status: Home / Self Care | Payer: BC Managed Care – PPO | Attending: General Surgery | Admitting: General Surgery

## 2018-12-18 DIAGNOSIS — Z79899 Other long term (current) drug therapy: Secondary | ICD-10-CM

## 2018-12-18 DIAGNOSIS — Z6841 Body Mass Index (BMI) 40.0 and over, adult: Secondary | ICD-10-CM

## 2018-12-18 DIAGNOSIS — Z8249 Family history of ischemic heart disease and other diseases of the circulatory system: Secondary | ICD-10-CM

## 2018-12-18 DIAGNOSIS — K219 Gastro-esophageal reflux disease without esophagitis: Secondary | ICD-10-CM | POA: Diagnosis not present

## 2018-12-18 DIAGNOSIS — F419 Anxiety disorder, unspecified: Secondary | ICD-10-CM | POA: Diagnosis not present

## 2018-12-18 DIAGNOSIS — E669 Obesity, unspecified: Secondary | ICD-10-CM | POA: Diagnosis present

## 2018-12-18 DIAGNOSIS — R5383 Other fatigue: Secondary | ICD-10-CM | POA: Diagnosis not present

## 2018-12-18 DIAGNOSIS — Z975 Presence of (intrauterine) contraceptive device: Secondary | ICD-10-CM

## 2018-12-18 HISTORY — PX: LAPAROSCOPIC GASTRIC SLEEVE RESECTION: SHX5895

## 2018-12-18 LAB — HEMOGLOBIN AND HEMATOCRIT, BLOOD
HCT: 38.6 % (ref 36.0–46.0)
Hemoglobin: 11.9 g/dL — ABNORMAL LOW (ref 12.0–15.0)

## 2018-12-18 LAB — TYPE AND SCREEN
ABO/RH(D): B POS
Antibody Screen: NEGATIVE

## 2018-12-18 LAB — PREGNANCY, URINE: Preg Test, Ur: NEGATIVE

## 2018-12-18 SURGERY — GASTRECTOMY, SLEEVE, LAPAROSCOPIC
Anesthesia: General | Site: Abdomen

## 2018-12-18 MED ORDER — SUGAMMADEX SODIUM 500 MG/5ML IV SOLN
INTRAVENOUS | Status: DC | PRN
  Administered 2018-12-18: 500 mg via INTRAVENOUS

## 2018-12-18 MED ORDER — EPHEDRINE 5 MG/ML INJ
INTRAVENOUS | Status: AC
  Filled 2018-12-18: qty 10

## 2018-12-18 MED ORDER — ONDANSETRON HCL 4 MG/2ML IJ SOLN
INTRAMUSCULAR | Status: DC | PRN
  Administered 2018-12-18 (×2): 4 mg via INTRAVENOUS

## 2018-12-18 MED ORDER — ROCURONIUM BROMIDE 10 MG/ML (PF) SYRINGE
PREFILLED_SYRINGE | INTRAVENOUS | Status: DC | PRN
  Administered 2018-12-18: 70 mg via INTRAVENOUS
  Administered 2018-12-18: 20 mg via INTRAVENOUS

## 2018-12-18 MED ORDER — PHENYLEPHRINE 40 MCG/ML (10ML) SYRINGE FOR IV PUSH (FOR BLOOD PRESSURE SUPPORT)
PREFILLED_SYRINGE | INTRAVENOUS | Status: DC | PRN
  Administered 2018-12-18 (×3): 80 ug via INTRAVENOUS

## 2018-12-18 MED ORDER — BUPIVACAINE LIPOSOME 1.3 % IJ SUSP
INTRAMUSCULAR | Status: DC | PRN
  Administered 2018-12-18: 20 mL

## 2018-12-18 MED ORDER — ENSURE MAX PROTEIN PO LIQD
2.0000 [oz_av] | ORAL | Status: DC
  Administered 2018-12-19 (×4): 2 [oz_av] via ORAL

## 2018-12-18 MED ORDER — GABAPENTIN 100 MG PO CAPS
200.0000 mg | ORAL_CAPSULE | Freq: Two times a day (BID) | ORAL | Status: DC
  Administered 2018-12-19: 200 mg via ORAL
  Filled 2018-12-18 (×2): qty 2

## 2018-12-18 MED ORDER — LIDOCAINE 2% (20 MG/ML) 5 ML SYRINGE
INTRAMUSCULAR | Status: AC
  Filled 2018-12-18: qty 5

## 2018-12-18 MED ORDER — FAMOTIDINE IN NACL 20-0.9 MG/50ML-% IV SOLN
20.0000 mg | Freq: Two times a day (BID) | INTRAVENOUS | Status: DC
  Administered 2018-12-18 – 2018-12-19 (×2): 20 mg via INTRAVENOUS
  Filled 2018-12-18 (×2): qty 50

## 2018-12-18 MED ORDER — DEXTROSE-NACL 5-0.45 % IV SOLN: INTRAVENOUS | Status: DC

## 2018-12-18 MED ORDER — SUCCINYLCHOLINE CHLORIDE 200 MG/10ML IV SOSY
PREFILLED_SYRINGE | INTRAVENOUS | Status: DC | PRN
  Administered 2018-12-18: 120 mg via INTRAVENOUS

## 2018-12-18 MED ORDER — HEPARIN SODIUM (PORCINE) 5000 UNIT/ML IJ SOLN
5000.0000 [IU] | INTRAMUSCULAR | Status: AC
  Administered 2018-12-18: 12:00:00 5000 [IU] via SUBCUTANEOUS
  Filled 2018-12-18: qty 1

## 2018-12-18 MED ORDER — LIDOCAINE 2% (20 MG/ML) 5 ML SYRINGE
INTRAMUSCULAR | Status: DC | PRN
  Administered 2018-12-18 (×2): 20 mg via INTRAVENOUS
  Administered 2018-12-18: 80 mg via INTRAVENOUS

## 2018-12-18 MED ORDER — SODIUM CHLORIDE 0.9 % IV SOLN
2.0000 g | INTRAVENOUS | Status: AC
  Administered 2018-12-18: 2 g via INTRAVENOUS
  Filled 2018-12-18: qty 2

## 2018-12-18 MED ORDER — ROCURONIUM BROMIDE 10 MG/ML (PF) SYRINGE
PREFILLED_SYRINGE | INTRAVENOUS | Status: AC
  Filled 2018-12-18: qty 10

## 2018-12-18 MED ORDER — FENTANYL CITRATE (PF) 100 MCG/2ML IJ SOLN
INTRAMUSCULAR | Status: AC
  Filled 2018-12-18: qty 2

## 2018-12-18 MED ORDER — DEXAMETHASONE SODIUM PHOSPHATE 10 MG/ML IJ SOLN
INTRAMUSCULAR | Status: DC | PRN
  Administered 2018-12-18: 10 mg via INTRAVENOUS

## 2018-12-18 MED ORDER — SCOPOLAMINE 1 MG/3DAYS TD PT72
1.0000 | MEDICATED_PATCH | TRANSDERMAL | Status: DC
  Administered 2018-12-18: 1.5 mg via TRANSDERMAL
  Filled 2018-12-18: qty 1

## 2018-12-18 MED ORDER — GLYCOPYRROLATE PF 0.2 MG/ML IJ SOSY
PREFILLED_SYRINGE | INTRAMUSCULAR | Status: AC
  Filled 2018-12-18: qty 3

## 2018-12-18 MED ORDER — APREPITANT 40 MG PO CAPS
40.0000 mg | ORAL_CAPSULE | ORAL | Status: AC
  Administered 2018-12-18: 12:00:00 40 mg via ORAL
  Filled 2018-12-18: qty 1

## 2018-12-18 MED ORDER — ACETAMINOPHEN 500 MG PO TABS
1000.0000 mg | ORAL_TABLET | Freq: Once | ORAL | Status: AC
  Administered 2018-12-18: 1000 mg via ORAL
  Filled 2018-12-18: qty 2

## 2018-12-18 MED ORDER — SUCCINYLCHOLINE CHLORIDE 200 MG/10ML IV SOSY
PREFILLED_SYRINGE | INTRAVENOUS | Status: AC
  Filled 2018-12-18: qty 10

## 2018-12-18 MED ORDER — ACETAMINOPHEN 500 MG PO TABS: 1000.0000 mg | ORAL_TABLET | ORAL | Status: DC

## 2018-12-18 MED ORDER — ONDANSETRON HCL 4 MG/2ML IJ SOLN
4.0000 mg | INTRAMUSCULAR | Status: DC | PRN
  Administered 2018-12-18: 4 mg via INTRAVENOUS
  Filled 2018-12-18: qty 2

## 2018-12-18 MED ORDER — ESMOLOL HCL 100 MG/10ML IV SOLN
INTRAVENOUS | Status: AC
  Filled 2018-12-18: qty 10

## 2018-12-18 MED ORDER — ACETAMINOPHEN 160 MG/5ML PO SOLN
1000.0000 mg | Freq: Three times a day (TID) | ORAL | Status: DC
  Administered 2018-12-18 – 2018-12-19 (×2): 1000 mg via ORAL
  Filled 2018-12-18 (×2): qty 40.6

## 2018-12-18 MED ORDER — MORPHINE SULFATE (PF) 2 MG/ML IV SOLN
1.0000 mg | INTRAVENOUS | Status: DC | PRN
  Administered 2018-12-18 (×2): 2 mg via INTRAVENOUS
  Filled 2018-12-18 (×2): qty 1

## 2018-12-18 MED ORDER — ACETAMINOPHEN 500 MG PO TABS
1000.0000 mg | ORAL_TABLET | Freq: Three times a day (TID) | ORAL | Status: DC
  Administered 2018-12-19: 1000 mg via ORAL
  Filled 2018-12-18: qty 2

## 2018-12-18 MED ORDER — OXYCODONE HCL 5 MG/5ML PO SOLN: 5.0000 mg | Freq: Four times a day (QID) | ORAL | Status: DC | PRN

## 2018-12-18 MED ORDER — MIDAZOLAM HCL 5 MG/5ML IJ SOLN
INTRAMUSCULAR | Status: DC | PRN
  Administered 2018-12-18: 2 mg via INTRAVENOUS

## 2018-12-18 MED ORDER — GABAPENTIN 300 MG PO CAPS
300.0000 mg | ORAL_CAPSULE | ORAL | Status: AC
  Administered 2018-12-18: 300 mg via ORAL
  Filled 2018-12-18: qty 1

## 2018-12-18 MED ORDER — SUGAMMADEX SODIUM 500 MG/5ML IV SOLN
INTRAVENOUS | Status: AC
  Filled 2018-12-18: qty 5

## 2018-12-18 MED ORDER — KETAMINE HCL 10 MG/ML IJ SOLN
INTRAMUSCULAR | Status: DC | PRN
  Administered 2018-12-18: 30 mg via INTRAVENOUS

## 2018-12-18 MED ORDER — DEXAMETHASONE SODIUM PHOSPHATE 4 MG/ML IJ SOLN: 4.0000 mg | INTRAMUSCULAR | Status: DC

## 2018-12-18 MED ORDER — FENTANYL CITRATE (PF) 100 MCG/2ML IJ SOLN
25.0000 ug | INTRAMUSCULAR | Status: DC | PRN
  Administered 2018-12-18 (×3): 50 ug via INTRAVENOUS

## 2018-12-18 MED ORDER — EPHEDRINE SULFATE-NACL 50-0.9 MG/10ML-% IV SOSY
PREFILLED_SYRINGE | INTRAVENOUS | Status: DC | PRN
  Administered 2018-12-18: 10 mg via INTRAVENOUS
  Administered 2018-12-18: 5 mg via INTRAVENOUS

## 2018-12-18 MED ORDER — 0.9 % SODIUM CHLORIDE (POUR BTL) OPTIME
TOPICAL | Status: DC | PRN
  Administered 2018-12-18: 13:00:00 1000 mL

## 2018-12-18 MED ORDER — PHENYLEPHRINE 40 MCG/ML (10ML) SYRINGE FOR IV PUSH (FOR BLOOD PRESSURE SUPPORT)
PREFILLED_SYRINGE | INTRAVENOUS | Status: AC
  Filled 2018-12-18: qty 10

## 2018-12-18 MED ORDER — CELECOXIB 200 MG PO CAPS
400.0000 mg | ORAL_CAPSULE | Freq: Once | ORAL | Status: AC
  Administered 2018-12-18: 400 mg via ORAL
  Filled 2018-12-18: qty 2

## 2018-12-18 MED ORDER — FENTANYL CITRATE (PF) 100 MCG/2ML IJ SOLN
INTRAMUSCULAR | Status: DC | PRN
  Administered 2018-12-18: 100 ug via INTRAVENOUS
  Administered 2018-12-18 (×3): 50 ug via INTRAVENOUS

## 2018-12-18 MED ORDER — STERILE WATER FOR IRRIGATION IR SOLN
Status: DC | PRN
  Administered 2018-12-18 (×2): 500 mL
  Administered 2018-12-18: 1000 mL

## 2018-12-18 MED ORDER — SIMETHICONE 80 MG PO CHEW: 80.0000 mg | CHEWABLE_TABLET | Freq: Four times a day (QID) | ORAL | Status: DC | PRN

## 2018-12-18 MED ORDER — ROCURONIUM BROMIDE 10 MG/ML (PF) SYRINGE
PREFILLED_SYRINGE | INTRAVENOUS | Status: AC
  Filled 2018-12-18: qty 20

## 2018-12-18 MED ORDER — BUPIVACAINE HCL 0.25 % IJ SOLN
INTRAMUSCULAR | Status: DC | PRN
  Administered 2018-12-18: 30 mL

## 2018-12-18 MED ORDER — FENTANYL CITRATE (PF) 250 MCG/5ML IJ SOLN
INTRAMUSCULAR | Status: AC
  Filled 2018-12-18: qty 5

## 2018-12-18 MED ORDER — CHLORHEXIDINE GLUCONATE 4 % EX LIQD: 60.0000 mL | Freq: Once | CUTANEOUS | Status: DC

## 2018-12-18 MED ORDER — LACTATED RINGERS IR SOLN
Status: DC | PRN
  Administered 2018-12-18: 1000 mL

## 2018-12-18 MED ORDER — PROMETHAZINE HCL 25 MG/ML IJ SOLN: 6.2500 mg | INTRAMUSCULAR | Status: DC | PRN

## 2018-12-18 MED ORDER — MIDAZOLAM HCL 2 MG/2ML IJ SOLN
INTRAMUSCULAR | Status: AC
  Filled 2018-12-18: qty 2

## 2018-12-18 MED ORDER — LACTATED RINGERS IV SOLN: INTRAVENOUS | Status: DC

## 2018-12-18 MED ORDER — GLYCOPYRROLATE PF 0.2 MG/ML IJ SOSY
PREFILLED_SYRINGE | INTRAMUSCULAR | Status: DC | PRN
  Administered 2018-12-18: .2 mg via INTRAVENOUS

## 2018-12-18 MED ORDER — ENOXAPARIN SODIUM 30 MG/0.3ML ~~LOC~~ SOLN
30.0000 mg | Freq: Two times a day (BID) | SUBCUTANEOUS | Status: DC
  Administered 2018-12-18 – 2018-12-19 (×2): 30 mg via SUBCUTANEOUS
  Filled 2018-12-18 (×2): qty 0.3

## 2018-12-18 MED ORDER — SCOPOLAMINE 1 MG/3DAYS TD PT72: 1.0000 | MEDICATED_PATCH | TRANSDERMAL | Status: DC

## 2018-12-18 MED ORDER — PROPOFOL 10 MG/ML IV BOLUS
INTRAVENOUS | Status: DC | PRN
  Administered 2018-12-18: 200 mg via INTRAVENOUS

## 2018-12-18 SURGICAL SUPPLY — 56 items
APPLIER CLIP ROT 13.4 12 LRG (CLIP) ×3
BAG LAPAROSCOPIC 12 15 PORT 16 (BASKET) ×1 IMPLANT
BAG RETRIEVAL 12/15 (BASKET) ×2
BAG RETRIEVAL 12/15MM (BASKET) ×1
BENZOIN TINCTURE PRP APPL 2/3 (GAUZE/BANDAGES/DRESSINGS) ×3 IMPLANT
BLADE SURG SZ11 CARB STEEL (BLADE) ×3 IMPLANT
BNDG ADH 1X3 SHEER STRL LF (GAUZE/BANDAGES/DRESSINGS) ×18 IMPLANT
CABLE HIGH FREQUENCY MONO STRZ (ELECTRODE) IMPLANT
CHLORAPREP W/TINT 26 (MISCELLANEOUS) ×3 IMPLANT
CLIP APPLIE ROT 13.4 12 LRG (CLIP) ×1 IMPLANT
CLOSURE WOUND 1/2 X4 (GAUZE/BANDAGES/DRESSINGS) ×1
COVER SURGICAL LIGHT HANDLE (MISCELLANEOUS) ×3 IMPLANT
COVER WAND RF STERILE (DRAPES) IMPLANT
DRAPE UTILITY XL STRL (DRAPES) ×6 IMPLANT
ELECT REM PT RETURN 15FT ADLT (MISCELLANEOUS) ×3 IMPLANT
GAUZE 4X4 16PLY RFD (DISPOSABLE) ×3 IMPLANT
GLOVE BIOGEL PI IND STRL 7.0 (GLOVE) ×1 IMPLANT
GLOVE BIOGEL PI INDICATOR 7.0 (GLOVE) ×2
GLOVE SURG SS PI 7.0 STRL IVOR (GLOVE) ×3 IMPLANT
GOWN STRL REUS W/TWL LRG LVL3 (GOWN DISPOSABLE) ×3 IMPLANT
GOWN STRL REUS W/TWL XL LVL3 (GOWN DISPOSABLE) ×9 IMPLANT
GRASPER SUT TROCAR 14GX15 (MISCELLANEOUS) ×3 IMPLANT
HOVERMATT SINGLE USE (MISCELLANEOUS) IMPLANT
KIT BASIN OR (CUSTOM PROCEDURE TRAY) ×3 IMPLANT
KIT TURNOVER KIT A (KITS) IMPLANT
MARKER SKIN DUAL TIP RULER LAB (MISCELLANEOUS) ×3 IMPLANT
NEEDLE SPNL 22GX3.5 QUINCKE BK (NEEDLE) ×3 IMPLANT
PACK UNIVERSAL I (CUSTOM PROCEDURE TRAY) ×3 IMPLANT
PENCIL SMOKE EVACUATOR (MISCELLANEOUS) IMPLANT
RELOAD STAPLER BLUE 60MM (STAPLE) ×3 IMPLANT
RELOAD STAPLER GOLD 60MM (STAPLE) IMPLANT
RELOAD STAPLER GREEN 60MM (STAPLE) ×1 IMPLANT
SCISSORS LAP 5X45 EPIX DISP (ENDOMECHANICALS) IMPLANT
SET IRRIG TUBING LAPAROSCOPIC (IRRIGATION / IRRIGATOR) ×3 IMPLANT
SET TUBE SMOKE EVAC HIGH FLOW (TUBING) ×3 IMPLANT
SHEARS HARMONIC ACE PLUS 45CM (MISCELLANEOUS) ×3 IMPLANT
SLEEVE GASTRECTOMY 40FR VISIGI (MISCELLANEOUS) ×3 IMPLANT
SLEEVE XCEL OPT CAN 5 100 (ENDOMECHANICALS) ×6 IMPLANT
SOL ANTI FOG 6CC (MISCELLANEOUS) ×1 IMPLANT
SOLUTION ANTI FOG 6CC (MISCELLANEOUS) ×2
STAPLER ECHELON LONG 60 440 (INSTRUMENTS) ×3 IMPLANT
STAPLER RELOAD BLUE 60MM (STAPLE) ×9
STAPLER RELOAD GOLD 60MM (STAPLE)
STAPLER RELOAD GREEN 60MM (STAPLE) ×3
STRIP CLOSURE SKIN 1/2X4 (GAUZE/BANDAGES/DRESSINGS) ×2 IMPLANT
SUT ETHIBOND 0 36 GRN (SUTURE) ×3 IMPLANT
SUT MNCRL AB 4-0 PS2 18 (SUTURE) ×3 IMPLANT
SUT VICRYL 0 TIES 12 18 (SUTURE) ×3 IMPLANT
SYR 20ML LL LF (SYRINGE) ×3 IMPLANT
SYR 50ML LL SCALE MARK (SYRINGE) ×3 IMPLANT
TOWEL OR 17X26 10 PK STRL BLUE (TOWEL DISPOSABLE) ×3 IMPLANT
TOWEL OR NON WOVEN STRL DISP B (DISPOSABLE) ×3 IMPLANT
TROCAR BLADELESS 15MM (ENDOMECHANICALS) ×3 IMPLANT
TROCAR BLADELESS OPT 5 100 (ENDOMECHANICALS) ×3 IMPLANT
TUBING CONNECTING 10 (TUBING) ×2 IMPLANT
TUBING CONNECTING 10' (TUBING) ×1

## 2018-12-18 NOTE — Progress Notes (Signed)
Discussed post op day goals with patient including ambulation, IS, diet progression, pain, and nausea control.  

## 2018-12-18 NOTE — Discharge Instructions (Signed)
° ° ° °GASTRIC BYPASS/SLEEVE ° Home Care Instructions ° ° These instructions are to help you care for yourself when you go home. ° °Call: If you have any problems. °• Call [phone] and ask for the surgeon on call °• If you need immediate help, come to the ER at [facility].  °• Tell the ER staff that you are a new post-op gastric bypass or gastric sleeve patient °  °Signs and symptoms to report: • Severe vomiting or nausea °o If you cannot keep down clear liquids for longer than 1 day, call your surgeon  °• Abdominal pain that does not get better after taking your pain medication °• Fever over 100.4° F with chills °• Heart beating over 100 beats a minute °• Shortness of breath at rest °• Chest pain °•  Redness, swelling, drainage, or foul odor at incision (surgical) sites °•  If your incisions open or pull apart °• Swelling or pain in calf (lower leg) °• Diarrhea (Loose bowel movements that happen often), frequent watery, uncontrolled bowel movements °• Constipation, (no bowel movements for 3 days) if this happens: Pick one °o Milk of Magnesia, 2 tablespoons by mouth, 3 times a day for 2 days if needed °o Stop taking Milk of Magnesia once you have a bowel movement °o Call your doctor if constipation continues °Or °o Miralax  (instead of Milk of Magnesia) following the label instructions °o Stop taking Miralax once you have a bowel movement °o Call your doctor if constipation continues °• Anything you think is not normal °  °Normal side effects after surgery: • Unable to sleep at night or unable to focus °• Irritability or moody °• Being tearful (crying) or depressed °These are common complaints, possibly related to your anesthesia medications that put you to sleep, stress of surgery, and change in lifestyle.  This usually goes away a few weeks after surgery.  If these feelings continue, call your primary care doctor. °  °Wound Care: You may have surgical glue, steri-strips, or staples over your incisions after  surgery °• Surgical glue:  Looks like a clear film over your incisions and will wear off a little at a time °• Steri-strips: Strips of tape over your incisions. You may notice a yellowish color on the skin under the steri-strips. This is used to make the   steri-strips stick better. Do not pull the steri-strips off - let them fall off °• Staples: Staples may be removed before you leave the hospital °o If you go home with staples, call [facility], [phone] at for an appointment with your surgeon’s nurse to have staples removed 10 days after surgery. °• Showering: You may shower two (2) days after your surgery unless your surgeon tells you differently °o Wash gently around incisions with warm soapy water, rinse well, and gently pat dry  °o No tub baths until staples are removed, steri-strips fall off or glue is gone.  °  °Medications: • Medications should be liquid or crushed if larger than the size of a dime °• Extended release pills (medication that release a little bit at a time through the day) should NOT be crushed or cut. (examples include XL, ER, DR, SR) °• Depending on the size and number of medications you take, you may need to space (take a few throughout the day)/change the time you take your medications so that you do not over-fill your pouch (smaller stomach) °• Make sure you follow-up with your primary care doctor to   make medication changes needed during rapid weight loss and life-style changes °• If you have diabetes, follow up with the doctor that orders your diabetes medication(s) within one week after surgery and check your blood sugar regularly. °• Do not drive while taking prescription pain medication  °• It is ok to take Tylenol by the bottle instructions with your pain medicine or instead of your pain medicine as needed.  DO NOT TAKE NSAIDS (EXAMPLES OF NSAIDS:  IBUPROFREN/ NAPROXEN)  °Diet:                    First 2 Weeks ° You will see the dietician t about two (2) weeks  after your surgery. The dietician will increase the types of foods you can eat if you are handling liquids well: °• If you have severe vomiting or nausea and cannot keep down clear liquids lasting longer than 1 day, call your surgeon @ ([phone]) °Protein Shake °• Drink at least 2 ounces of shake 5-6 times per day °• Each serving of protein shakes (usually 8 - 12 ounces) should have: °o 15 grams of protein  °o And no more than 5 grams of carbohydrate  °• Goal for protein each day: °o Men = 80 grams per day °o Women = 60 grams per day °• Protein powder may be added to fluids such as non-fat milk or Lactaid milk or unsweetened Soy/Almond milk (limit to 35 grams added protein powder per serving) ° °Hydration °• Slowly increase the amount of water and other clear liquids as tolerated (See Acceptable Fluids) °• Slowly increase the amount of protein shake as tolerated  °•  Sip fluids slowly and throughout the day.  Do not use straws. °• May use sugar substitutes in small amounts (no more than 6 - 8 packets per day; i.e. Splenda) ° °Fluid Goal °• The first goal is to drink at least 8 ounces of protein shake/drink per day (or as directed by the nutritionist); some examples of protein shakes are Syntrax Nectar, Adkins Advantage, EAS Edge HP, and Unjury. See handout from pre-op Bariatric Education Class: °o Slowly increase the amount of protein shake you drink as tolerated °o You may find it easier to slowly sip shakes throughout the day °o It is important to get your proteins in first °• Your fluid goal is to drink 64 - 100 ounces of fluid daily °o It may take a few weeks to build up to this °• 32 oz (or more) should be clear liquids  °And  °• 32 oz (or more) should be full liquids (see below for examples) °• Liquids should not contain sugar, caffeine, or carbonation ° °Clear Liquids: °• Water or Sugar-free flavored water (i.e. Fruit H2O, Propel) °• Decaffeinated coffee or tea (sugar-free) °• Crystal Lite, Wyler’s Lite,  Minute Maid Lite °• Sugar-free Jell-O °• Bouillon or broth °• Sugar-free Popsicle:   *Less than 20 calories each; Limit 1 per day ° °Full Liquids: °Protein Shakes/Drinks + 2 choices per day of other full liquids °• Full liquids must be: °o No More Than 15 grams of Carbs per serving  °o No More Than 3 grams of Fat per serving °• Strained low-fat cream soup (except Cream of Potato or Tomato) °• Non-Fat milk °• Fat-free Lactaid Milk °• Unsweetened Soy Or Unsweetened Almond Milk °• Low Sugar yogurt (Dannon Lite & Fit, Greek yogurt; Oikos Triple Zero; Chobani Simply 100; Yoplait 100 calorie Greek - No Fruit on the Bottom) ° °  °Vitamins   and Minerals • Start 1 day after surgery unless otherwise directed by your surgeon °• 2 Chewable Bariatric Specific Multivitamin / Multimineral Supplement with iron (Example: Bariatric Advantage Multi EA) °• Chewable Calcium with Vitamin D-3 °(Example: 3 Chewable Calcium Plus 600 with Vitamin D-3) °o Take 500 mg three (3) times a day for a total of 1500 mg each day °o Do not take all 3 doses of calcium at one time as it may cause constipation, and you can only absorb 500 mg  at a time  °o Do not mix multivitamins containing iron with calcium supplements; take 2 hours apart °• Menstruating women and those with a history of anemia (a blood disease that causes weakness) may need extra iron °o Talk with your doctor to see if you need more iron °• Do not stop taking or change any vitamins or minerals until you talk to your dietitian or surgeon °• Your Dietitian and/or surgeon must approve all vitamin and mineral supplements °  °Activity and Exercise: Limit your physical activity as instructed by your doctor.  It is important to continue walking at home.  During this time, use these guidelines: °• Do not lift anything greater than ten (10) pounds for at least two (2) weeks °• Do not go back to work or drive until your surgeon says you can °• You may have sex when you feel comfortable  °o It is  VERY important for female patients to use a reliable birth control method; fertility often increases after surgery  °o All hormonal birth control will be ineffective for 30 days after surgery due to medications given during surgery a barrier method must be used. °o Do not get pregnant for at least 18 months °• Start exercising as soon as your doctor tells you that you can °o Make sure your doctor approves any physical activity °• Start with a simple walking program °• Walk 5-15 minutes each day, 7 days per week.  °• Slowly increase until you are walking 30-45 minutes per day °Consider joining our BELT program. [phone] or email [email or web address] °  °Special Instructions Things to remember: °• Use your CPAP when sleeping if this applies to you ° °• [facility] has two free Bariatric Surgery Support Groups that meet monthly °o The 3rd Thursday of each month, 6 pm, [facility]  °o The 2nd Friday of each month, 11:45 am in the private dining room in the basement of [facility] °• It is very important to keep all follow up appointments with your surgeon, dietitian, primary care physician, and behavioral health practitioner °• Routine follow up schedule with your surgeon include appointments at 2-3 weeks, 6-8 weeks, 6 months, and 1 year at a minimum.  Your surgeon may request to see you more often.   °o After the first year, please follow up with your bariatric surgeon and dietitian at least once a year in order to maintain best weight loss results °[facility]: [phone] °[facility]: [phone] °Bariatric Nurse Coordinator: [phone] °  °   Reviewed and Endorsed  °by [facility] Patient Education Committee, June, 2016 °Edits Approved: Aug, 2018 ° ° ° °

## 2018-12-18 NOTE — Progress Notes (Signed)
PHARMACY CONSULT FOR:  Risk Assessment for Post-Discharge VTE Following Bariatric Surgery  Post-Discharge VTE Risk Assessment: This patient's probability of 30-day post-discharge VTE is increased due to the factors marked:   Female    Age >/=60 years  x  BMI >/=50 kg/m2    CHF    Dyspnea at Rest    Paraplegia   x Non-gastric-band surgery    Operation Time >/=3 hr    Return to OR     Length of Stay >/= 3 d      Hx of VTE   Hypercoagulable condition   Significant venous stasis   Predicted probability of 30-day post-discharge VTE: 0.27%    Recommendation for Discharge: No pharmacologic prophylaxis post-discharge  Amanda Ruiz is a 33 y.o. female who underwent laparoscopic sleeve gastrectomy on 12/18/2018    Case start: 1243 Case end: 1335   No Known Allergies  Patient Measurements: Height: 5\' 4"  (162.6 cm) Weight: (!) 300 lb 9.6 oz (136.4 kg) IBW/kg (Calculated) : 54.7 Body mass index is 51.6 kg/m.  No results for input(s): WBC, HGB, HCT, PLT, APTT, CREATININE, LABCREA, CREATININE, CREAT24HRUR, MG, PHOS, ALBUMIN, PROT, ALBUMIN, AST, ALT, ALKPHOS, BILITOT, BILIDIR, IBILI in the last 72 hours. Estimated Creatinine Clearance: 139.3 mL/min (by C-G formula based on SCr of 0.72 mg/dL).    Past Medical History:  Diagnosis Date  . Anxiety   . GERD (gastroesophageal reflux disease)   . Incomplete right bundle branch block (RBBB) 03/16/2018   noted on EKG  . Morbid obesity ([place])   . Muscle spasm    takes Magnesium  . Sciatica    Left  . Vertigo      Medications Prior to Admission  Medication Sig Dispense Refill Last Dose  . Levonorgestrel (SKYLA) 13.5 MG IUD 13.5 mg by Intrauterine route once.    in place now  . Magnesium 250 MG TABS Take by mouth.   12/17/2018  . Multiple Vitamin (MULTIVITAMIN WITH MINERALS) TABS tablet Take 1 tablet by mouth daily.   12/14/2018  . Vitamin E 180 MG CAPS Take 180 mg by mouth daily.   12/17/2018       Dolly Rias  RPh 12/18/2018, 1:58 PM

## 2018-12-18 NOTE — Anesthesia Postprocedure Evaluation (Signed)
Anesthesia Post Note  Patient: Amanda Ruiz  Procedure(s) Performed: LAPAROSCOPIC GASTRIC SLEEVE RESECTION, UPPER ENDOSCOPY, ERAS PATHWAY (N/A Abdomen)     Patient location during evaluation: PACU Anesthesia Type: General Level of consciousness: sedated Pain management: pain level controlled Vital Signs Assessment: post-procedure vital signs reviewed and stable Respiratory status: spontaneous breathing and respiratory function stable Cardiovascular status: stable Postop Assessment: no apparent nausea or vomiting Anesthetic complications: no    Last Vitals:  Vitals:   12/18/18 1112 12/18/18 1342  BP: 138/87 (!) 152/104  Pulse: 99 (!) 106  Resp: 18 12  Temp: 37.1 C 36.9 C  SpO2: 100% 100%    Last Pain:  Vitals:   12/18/18 1430  TempSrc:   PainSc: Asleep                 Hildagard Sobecki DANIEL

## 2018-12-18 NOTE — Anesthesia Preprocedure Evaluation (Addendum)
Anesthesia Evaluation  Patient identified by MRN, date of birth, ID band Patient awake    Reviewed: Allergy & Precautions, NPO status , Patient's Chart, lab work & pertinent test results  History of Anesthesia Complications Negative for: history of anesthetic complications  Airway Mallampati: II  TM Distance: >3 FB Neck ROM: Full    Dental no notable dental hx. (+) Dental Advisory Given   Pulmonary neg pulmonary ROS,    Pulmonary exam normal        Cardiovascular negative cardio ROS Normal cardiovascular exam     Neuro/Psych PSYCHIATRIC DISORDERS Anxiety negative neurological ROS     GI/Hepatic Neg liver ROS, GERD  ,  Endo/Other  Morbid obesity  Renal/GU negative Renal ROS     Musculoskeletal negative musculoskeletal ROS (+)   Abdominal   Peds  Hematology negative hematology ROS (+)   Anesthesia Other Findings Day of surgery medications reviewed with the patient.  Reproductive/Obstetrics                            Anesthesia Physical Anesthesia Plan  ASA: III  Anesthesia Plan: General   Post-op Pain Management:    Induction: Intravenous  PONV Risk Score and Plan: 4 or greater and Ondansetron, Dexamethasone, Scopolamine patch - Pre-op and Midazolam  Airway Management Planned: Oral ETT  Additional Equipment:   Intra-op Plan:   Post-operative Plan: Extubation in OR  Informed Consent: I have reviewed the patients History and Physical, chart, labs and discussed the procedure including the risks, benefits and alternatives for the proposed anesthesia with the patient or authorized representative who has indicated his/her understanding and acceptance.     Dental advisory given  Plan Discussed with: Anesthesiologist, CRNA and Surgeon  Anesthesia Plan Comments:        Anesthesia Quick Evaluation

## 2018-12-18 NOTE — Op Note (Signed)
Preop Diagnosis: Obesity Class III  Postop Diagnosis: same  Procedure performed: laparoscopic Sleeve Gastrectomy  Assitant: Phylliss Blakes  Indications:  The patient is a 33 y.o. year-old morbidly obese female who has been followed in the Bariatric Clinic as an outpatient. This patient was diagnosed with morbid obesity with a BMI of Body mass index is 51.6 kg/m.  The patient was counseled extensively in the Bariatric Outpatient Clinic and after a thorough explanation of the risks and benefits of surgery (including death from complications, bowel leak, infection such as peritonitis and/or sepsis, internal hernia, bleeding, need for blood transfusion, bowel obstruction, organ failure, pulmonary embolus, deep venous thrombosis, wound infection, incisional hernia, skin breakdown, and others entailed on the consent form) and after a compliant diet and exercise program, the patient was scheduled for an elective laparoscopic sleeve gastrectomy.  Description of Operation:  Following informed consent, the patient was taken to the operating room and placed on the operating table in the supine position.  She had previously received prophylactic antibiotics and subcutaneous heparin for DVT prophylaxis in the pre-op holding area.  After induction of general endotracheal anesthesia by the anesthesiologist, the patient underwent placement of sequential compression devices and an oro-gastric tube.  A timeout was confirmed by the surgery and anesthesia teams.  The patient was adequately padded at all pressure points and placed on a footboard to prevent slippage from the OR table during extremes of position during surgery.  She underwent a routine sterile prep and drape of her entire abdomen.    Next, A transverse incision was made under the left subcostal area and a 52mm optical viewing trocar was introduced into the peritoneal cavity. Pneumoperitoneum was applied with a high flow and low pressure. A laparoscope was  inserted to confirm placement. A extraperitoneal block was then placed at the lateral abdominal wall using exparel diluted with marcaine. 5 additional incisions were placed: 1 42mm trocar to the left of the midline. 1 additional 53mm trocar in the left lateral area, 1 60mm trocar in the right mid abdomen, 1 64mm trocar in the right subcostal area, and a Nathanson retractor was placed through a subxiphoid incision.  The fat pad at the GE junction was incised and the gastrodiaphragmatic ligament was divided using the Harmonic scalpel. Next, a hole was created through the lesser omentum along the greater curve of the stomach to enter the lesser sac. The vessels along the greater omentum were  Then ligated and divided using the Harmonic scalpel moving towards the spleen and then short gastric vessels were ligated and divided in the same fashion to fully mobilize the fundus. The left crus was identified to ensure completion of the dissection. Next the antrum was measured and dissection continued inferiorly along the greater curve towards the pylorus and stopped 6cm from the pylorus.   A 40Fr ViSiGi dilator was placed into the esophgaus and along the lesser curve of the stomach and placed on suction. 1 non-reinforced 36mm Green load echelon stapler(s) followed by 1 64mm Gold load echelon stapler(s) followed by 4 76mm blue load echelon stapler(s) were used to make the resection along the antrum being sure to stay well away from the angularis by angling the jaws of the stapler towards the greater curve and later completing the resection staying along the ViSiGi and ensuring the fundus was not retained by appropriately retracting it lateral. Air was inserted through the ViSiGi to perform a leak test showing no bubbles and a neutral lie of the stomach.  The  assistant then went and performed an upper endoscopy and leak test. No bubbles were seen and the sleeve and antrum distended appropriately. The specimen was then placed  in an endocatch bag and removed by the 65mm port. The fascia of the 74mm port was closed with a 0 vicryl by suture passer. Hemostasis was ensured. Pneumoperitoneum was evacuated, all ports were removed and all incisions closed with 4-0 monocryl suture in subcuticular fashion. Steristrips and bandaids were put in place for dressing. The patient awoke from anesthesia and was brought to pacu in stable condition. All counts were correct.  Estimated blood loss: <28ml  Specimens:  Sleeve gastrectomy  Local Anesthesia: 50 ml Exparel:0.5% Marcaine mix  Post-Op Plan:       Pain Management: PO, prn      Antibiotics: Prophylactic      Anticoagulation: Prophylactic, Starting now      Post Op Studies/Consults: Not applicable      Intended Discharge: within 48h      Intended Outpatient Follow-Up: Two Week      Intended Outpatient Studies: Not Applicable      Other: Not Applicable   Amanda Ruiz

## 2018-12-18 NOTE — Op Note (Signed)
Preoperative diagnosis: laparoscopic sleeve gastrectomy  Postoperative diagnosis: Same   Procedure: Upper endoscopy   Surgeon: Dannell Raczkowski A Loraine Bhullar, M.D.  Anesthesia: Gen.   Description of procedure: The endoscopy was placed in the mouth and into the oropharynx and under endoscopic vision it was advanced to the esophagogastric junction which was identified at 40 from the teeth.  The pouch was tensely insufflated while the upper abdomen was flooded with irrigation to perform a leak test, which was negative. No bubbles were seen.  The staple line was hemostatic and the lumen was evenly tubular without undue narrowing, angulation or twisting specifically at the incisura angularis. The lumen was decompressed and the scope was withdrawn without difficulty.    Joyce Leckey A Ankush Gintz, M.D. General, Bariatric, & Minimally Invasive Surgery [facility]    

## 2018-12-18 NOTE — Anesthesia Procedure Notes (Signed)
Procedure Name: Intubation Performed by: Lavina Hamman, CRNA Pre-anesthesia Checklist: Patient identified, Emergency Drugs available, Suction available, Patient being monitored and Timeout performed Patient Re-evaluated:Patient Re-evaluated prior to induction Oxygen Delivery Method: Circle system utilized Preoxygenation: Pre-oxygenation with 100% oxygen Induction Type: IV induction Ventilation: Mask ventilation without difficulty Laryngoscope Size: Mac and 4 Grade View: Grade II Tube type: Oral Tube size: 7.5 mm Number of attempts: 1 Airway Equipment and Method: Stylet Placement Confirmation: ETT inserted through vocal cords under direct vision,  positive ETCO2,  CO2 detector and breath sounds checked- equal and bilateral Secured at: 21 cm Tube secured with: Tape Dental Injury: Teeth and Oropharynx as per pre-operative assessment

## 2018-12-18 NOTE — Transfer of Care (Signed)
Immediate Anesthesia Transfer of Care Note  Patient: Amanda Ruiz  Procedure(s) Performed: Procedure(s): LAPAROSCOPIC GASTRIC SLEEVE RESECTION, UPPER ENDOSCOPY, ERAS PATHWAY (N/A)  Patient Location: PACU  Anesthesia Type:General  Level of Consciousness:  sedated, patient cooperative and responds to stimulation  Airway & Oxygen Therapy:Patient Spontanous Breathing and Patient connected to face mask oxgen  Post-op Assessment:  Report given to PACU RN and Post -op Vital signs reviewed and stable  Post vital signs:  Reviewed and stable  Last Vitals:  Vitals:   12/18/18 1112  BP: 138/87  Pulse: 99  Resp: 18  Temp: 37.1 C  SpO2: 539%    Complications: No apparent anesthesia complications

## 2018-12-18 NOTE — H&P (Signed)
Amanda Ruiz is an 33 y.o. female.   Chief Complaint: obesity HPI: 33 yo female with long history of obesity. She has completed all requirements and presents for bariatric surgery.  Past Medical History:  Diagnosis Date  . Anxiety   . GERD (gastroesophageal reflux disease)   . Incomplete right bundle branch block (RBBB) 03/16/2018   noted on EKG  . Morbid obesity ([facility])   . Muscle spasm    takes Magnesium  . Sciatica    Left  . Vertigo     Past Surgical History:  Procedure Laterality Date  . BREATH TEK H PYLORI N/A 07/29/2013   Procedure: BREATH TEK H PYLORI;  Surgeon: Valarie Merino, MD;  Location: [facility];  Service: General;  Laterality: N/A;    Family History  Problem Relation Age of Onset  . Hypertension Other    Social History:  reports that she has never smoked. She has never used smokeless tobacco. She reports current alcohol use. She reports current drug use. Drug: Marijuana.  Allergies: No Known Allergies  Medications Prior to Admission  Medication Sig Dispense Refill  . Levonorgestrel (SKYLA) 13.5 MG IUD 13.5 mg by Intrauterine route once.     . Magnesium 250 MG TABS Take by mouth.    . Multiple Vitamin (MULTIVITAMIN WITH MINERALS) TABS tablet Take 1 tablet by mouth daily.    . Vitamin E 180 MG CAPS Take 180 mg by mouth daily.      Results for orders placed or performed during the hospital encounter of 12/18/18 (from the past 48 hour(s))  Pregnancy, urine STAT morning of surgery     Status: None   Collection Time: 12/18/18 11:09 AM  Result Value Ref Range   Preg Test, Ur NEGATIVE NEGATIVE    Comment:        THE SENSITIVITY OF THIS METHODOLOGY IS >20 mIU/mL. Performed at [facility], [address] [place], [place] [address]    No results found.  Review of Systems  Constitutional: Negative for chills and fever.  HENT: Negative for hearing loss.   Respiratory: Negative for cough.   Cardiovascular: Negative for chest pain  and palpitations.  Gastrointestinal: Negative for abdominal pain, nausea and vomiting.  Genitourinary: Negative for dysuria and urgency.  Musculoskeletal: Negative for myalgias and neck pain.  Skin: Negative for rash.  Neurological: Negative for dizziness and headaches.  Hematological: Does not bruise/bleed easily.  Psychiatric/Behavioral: Negative for suicidal ideas.    Blood pressure 138/87, pulse 99, temperature 98.7 F (37.1 C), temperature source Oral, resp. rate 18, height 5\' 4"  (1.626 m), weight (!) 136.4 kg, SpO2 100 %. Physical Exam  Vitals reviewed. Constitutional: She is oriented to person, place, and time. She appears well-developed and well-nourished.  HENT:  Head: Normocephalic and atraumatic.  Eyes: Pupils are equal, round, and reactive to light. Conjunctivae and EOM are normal.  Cardiovascular: Normal rate and regular rhythm.  Respiratory: Effort normal and breath sounds normal.  GI: Soft. Bowel sounds are normal. She exhibits no distension. There is no abdominal tenderness.  Musculoskeletal:        General: Normal range of motion.     Cervical back: Normal range of motion and neck supple.  Neurological: She is alert and oriented to person, place, and time.  Skin: Skin is warm and dry.  Psychiatric: She has a normal mood and affect. Her behavior is normal.     Assessment/Plan 33 yo female with obesity -lap sleeve gastrectomy -ERAS and bariatric protocols -  inpatient stay  [name] Lido Maske, MD 12/18/2018, 11:58 AM

## 2018-12-19 LAB — CBC WITH DIFFERENTIAL/PLATELET
Abs Immature Granulocytes: 0.04 10*3/uL (ref 0.00–0.07)
Basophils Absolute: 0 10*3/uL (ref 0.0–0.1)
Basophils Relative: 0 %
Eosinophils Absolute: 0 10*3/uL (ref 0.0–0.5)
Eosinophils Relative: 0 %
HCT: 37.9 % (ref 36.0–46.0)
Hemoglobin: 11.6 g/dL — ABNORMAL LOW (ref 12.0–15.0)
Immature Granulocytes: 0 %
Lymphocytes Relative: 10 %
Lymphs Abs: 0.9 10*3/uL (ref 0.7–4.0)
MCH: 24.5 pg — ABNORMAL LOW (ref 26.0–34.0)
MCHC: 30.6 g/dL (ref 30.0–36.0)
MCV: 80.1 fL (ref 80.0–100.0)
Monocytes Absolute: 0.5 10*3/uL (ref 0.1–1.0)
Monocytes Relative: 5 %
Neutro Abs: 8.4 10*3/uL — ABNORMAL HIGH (ref 1.7–7.7)
Neutrophils Relative %: 85 %
Platelets: 313 10*3/uL (ref 150–400)
RBC: 4.73 MIL/uL (ref 3.87–5.11)
RDW: 15 % (ref 11.5–15.5)
WBC: 9.9 10*3/uL (ref 4.0–10.5)
nRBC: 0 % (ref 0.0–0.2)

## 2018-12-19 LAB — COMPREHENSIVE METABOLIC PANEL
ALT: 17 U/L (ref 0–44)
AST: 14 U/L — ABNORMAL LOW (ref 15–41)
Albumin: 3.7 g/dL (ref 3.5–5.0)
Alkaline Phosphatase: 74 U/L (ref 38–126)
Anion gap: 8 (ref 5–15)
BUN: 6 mg/dL (ref 6–20)
CO2: 23 mmol/L (ref 22–32)
Calcium: 9.3 mg/dL (ref 8.9–10.3)
Chloride: 107 mmol/L (ref 98–111)
Creatinine, Ser: 0.71 mg/dL (ref 0.44–1.00)
GFR calc Af Amer: >60 mL/min (ref >60)
GFR calc non Af Amer: >60 mL/min (ref >60)
Glucose, Bld: 141 mg/dL — ABNORMAL HIGH (ref 70–99)
Potassium: 4.2 mmol/L (ref 3.5–5.1)
Sodium: 138 mmol/L (ref 135–145)
Total Bilirubin: 0.3 mg/dL (ref 0.3–1.2)
Total Protein: 7.8 g/dL (ref 6.5–8.1)

## 2018-12-19 MED ORDER — GABAPENTIN 100 MG PO CAPS: 200.0000 mg | ORAL_CAPSULE | Freq: Two times a day (BID) | ORAL | 0 refills | Status: DC

## 2018-12-19 MED ORDER — ONDANSETRON 4 MG PO TBDP: 4.0000 mg | ORAL_TABLET | Freq: Four times a day (QID) | ORAL | 0 refills | Status: DC | PRN

## 2018-12-19 MED ORDER — PANTOPRAZOLE SODIUM 40 MG PO TBEC: 40.0000 mg | DELAYED_RELEASE_TABLET | Freq: Every day | ORAL | 0 refills | Status: DC

## 2018-12-19 MED ORDER — ACETAMINOPHEN 500 MG PO TABS: 1000.0000 mg | ORAL_TABLET | Freq: Three times a day (TID) | ORAL | 0 refills | Status: AC

## 2018-12-19 NOTE — Discharge Summary (Signed)
Physician Discharge Summary  Amanda Ruiz JME:[id number] DOB: 1985-03-17 DOA: 12/18/2018  PCP: Patient, No Pcp Per  Admit date: 12/18/2018 Discharge date: 12/19/2018  Recommendations for Outpatient Follow-up:  1.  (include homehealth, outpatient follow-up instructions, specific recommendations for PCP to follow-up on, etc.)  Follow-up Information    [name], [name], MD. Go on 01/11/2019.   Specialty: General Surgery Why: at [address] Contact information: [address] [place] [address] [phone]        [facility], [place]. Go on 02/08/2019.   Specialty: General Surgery Why: at [address] information: [address] [place] [address] [phone]          Discharge Diagnoses:  Active Problems:   Obesity   Surgical Procedure: laparoscopic sleeve gastrectomy, upper endoscopy  Discharge Condition: Good Disposition: Home  Diet recommendation: Postoperative sleeve gastrectomy diet (liquids only)  Filed Weights   12/18/18 1112  Weight: (!) 136.4 kg     Hospital Course:  The patient was admitted after undergoing laparoscopic sleeve gastrectomy. POD 0 she ambulated well. POD 1 she was started on the water diet protocol and tolerated 200 ml in the first shift. Once meeting the water amount she was advanced to bariatric protein shakes which they tolerated and were discharged home POD 1.  Treatments: surgery: laparoscopic sleeve gastrectomy  Discharge Instructions  Discharge Instructions    Ambulate hourly while awake   Complete by: As directed    Call MD for:  difficulty breathing, headache or visual disturbances   Complete by: As directed    Call MD for:  persistant dizziness or light-headedness   Complete by: As directed    Call MD for:  persistant nausea and vomiting   Complete by: As directed    Call MD for:  redness, tenderness, or signs of infection (pain, swelling, redness, odor or green/yellow discharge around  incision site)   Complete by: As directed    Call MD for:  severe uncontrolled pain   Complete by: As directed    Call MD for:  temperature >101 F   Complete by: As directed    Diet bariatric full liquid   Complete by: As directed    Discharge wound care:   Complete by: As directed    Remove Bandaids tomorrow, ok to shower tomorrow. Steristrips may fall off in 1-3 weeks.   Incentive spirometry   Complete by: As directed    Perform hourly while awake     Allergies as of 12/19/2018   No Known Allergies     Medication List    TAKE these medications   acetaminophen 500 MG tablet Commonly known as: TYLENOL Take 2 tablets (1,000 mg total) by mouth every 8 (eight) hours for 5 days.   gabapentin 100 MG capsule Commonly known as: NEURONTIN Take 2 capsules (200 mg total) by mouth every 12 (twelve) hours.   Magnesium 250 MG Tabs Take by mouth.   multivitamin with minerals Tabs tablet Take 1 tablet by mouth daily.   ondansetron 4 MG disintegrating tablet Commonly known as: ZOFRAN-ODT Take 1 tablet (4 mg total) by mouth every 6 (six) hours as needed for nausea or vomiting.   pantoprazole 40 MG tablet Commonly known as: PROTONIX Take 1 tablet (40 mg total) by mouth daily.   Skyla 13.5 MG Iud Generic drug: Levonorgestrel 13.5 mg by Intrauterine route once.   Vitamin E 180 MG Caps Take 180 mg by mouth daily.  Discharge Care Instructions  (From admission, onward)         Start     Ordered   12/19/18 0000  Discharge wound care:    Comments: Remove Bandaids tomorrow, ok to shower tomorrow. Steristrips may fall off in 1-3 weeks.   12/19/18 7510         Follow-up Information    [name], [name], MD. Go on 01/11/2019.   Specialty: General Surgery Why: at [place] information: [place] [address] [phone]        Sur[facility], [place]. Go on 02/08/2019.   Specialty: General Surgery Why: at [address]  information: [place] [address] [phone]            The results of significant diagnostics from this hospitalization (including imaging, microbiology, ancillary and laboratory) are listed below for reference.    Significant Diagnostic Studies: No results found.  Labs: Basic Metabolic Panel: Recent Labs  Lab 12/14/18 1341 12/19/18 0443  NA 139 138  K 3.8 4.2  CL 106 107  CO2 23 23  GLUCOSE 77 141*  BUN 12 6  CREATININE 0.72 0.71  CALCIUM 9.4 9.3   Liver Function Tests: Recent Labs  Lab 12/14/18 1341 12/19/18 0443  AST 14* 14*  ALT 19 17  ALKPHOS 82 74  BILITOT 0.4 0.3  PROT 8.0 7.8  ALBUMIN 3.9 3.7    CBC: Recent Labs  Lab 12/14/18 1341 12/18/18 1407 12/19/18 0443  WBC 8.2  --  9.9  NEUTROABS 5.5  --  8.4*  HGB 11.8* 11.9* 11.6*  HCT 38.0 38.6 37.9  MCV 80.3  --  80.1  PLT 323  --  313    CBG: No results for input(s): GLUCAP in the last 168 hours.  Active Problems:   Obesity   VTE plan: no chemical prophylaxis recommended ([email or web address])  Time coordinating discharge: 15 min

## 2018-12-19 NOTE — Progress Notes (Signed)
Patient alert and oriented, pain is controlled. Patient is tolerating fluids, advanced to protein shake today, patient is tolerating well.  Reviewed Gastric sleeve discharge instructions with patient and patient is able to articulate understanding.  Provided information on BELT program, Support Group and [facility] outpatient pharmacy. All questions answered, will continue to monitor.   Fluid intake 570

## 2018-12-19 NOTE — Plan of Care (Signed)
Pt has had DC teaching by Parks Neptune RN and is ready for DC home.

## 2018-12-20 LAB — SURGICAL PATHOLOGY

## 2018-12-24 ENCOUNTER — Telehealth (HOSPITAL_COMMUNITY): Payer: Self-pay

## 2018-12-24 NOTE — Telephone Encounter (Addendum)
Patient called to discuss post bariatric surgery follow up questions.  See below:   1.  Tell me about your pain and pain management?pain largest incision, hasn't needed any other medications  2.  Let's talk about fluid intake.  How much total fluid are you taking in? 53-62 ounces  3.  How much protein have you taken in the last 2 days? 49-60  4.  Have you had nausea?  Tell me about when have experienced nausea and what you did to help?denies  5.  Has the frequency or color changed with your urine?urine color normal  6.  Tell me what your incisions look like?no problems with incision  7.  Have you been passing gas? BM?BM after miralax  8.  If a problem or question were to arise who would you call?  Do you know contact numbers for [place], [facility], and [facility]?  9.  How has the walking going?walking regularly  10.  How are your vitamins and calcium going?  How are you taking them?mvi and calcium without difficulty

## 2018-12-25 ENCOUNTER — Emergency Department (HOSPITAL_BASED_OUTPATIENT_CLINIC_OR_DEPARTMENT_OTHER)
Admit: 2018-12-25 | Discharge: 2018-12-25 | Disposition: A | Discharge status: Home / Self Care | Payer: BC Managed Care – PPO | Attending: Emergency Medicine | Admitting: Emergency Medicine

## 2018-12-25 ENCOUNTER — Encounter (HOSPITAL_COMMUNITY): Payer: Self-pay | Admitting: *Deleted

## 2018-12-25 ENCOUNTER — Emergency Department (HOSPITAL_COMMUNITY)
Admission: EM | Admit: 2018-12-25 | Discharge: 2018-12-25 | Disposition: A | Discharge status: Home / Self Care | Payer: BC Managed Care – PPO | Attending: Emergency Medicine | Admitting: Emergency Medicine

## 2018-12-25 ENCOUNTER — Other Ambulatory Visit: Payer: Self-pay

## 2018-12-25 DIAGNOSIS — Z79899 Other long term (current) drug therapy: Secondary | ICD-10-CM | POA: Diagnosis not present

## 2018-12-25 DIAGNOSIS — R600 Localized edema: Secondary | ICD-10-CM | POA: Insufficient documentation

## 2018-12-25 DIAGNOSIS — M79604 Pain in right leg: Secondary | ICD-10-CM | POA: Diagnosis not present

## 2018-12-25 DIAGNOSIS — M7989 Other specified soft tissue disorders: Secondary | ICD-10-CM

## 2018-12-25 NOTE — Discharge Instructions (Addendum)
Elevate your leg,  Return if any problems.

## 2018-12-25 NOTE — Progress Notes (Signed)
Right lower extremity venous duplex has been completed. Preliminary results can be found in CV Proc through chart review.  Results were given to [facility] PA.  12/25/18 11:55 AM Carlos Levering RVT

## 2018-12-25 NOTE — ED Provider Notes (Signed)
[facility] DEPT Provider Note   CSN: [id number] Arrival date & time: 12/25/18  1028     History Chief Complaint  Patient presents with  . Leg Swelling    Amanda Ruiz is a 33 y.o. female.  The history is provided by the patient. No language interpreter was used.  Leg Pain Location:  Leg Time since incident:  2 days Injury: no   Leg location:  R leg Pain details:    Quality: swelling.   Severity:  No pain   Timing:  Constant Foreign body present:  No foreign bodies Relieved by:  Nothing  Pt had a gastic sleeve 12/15. Pt was told to come in if any leg swelling     Past Medical History:  Diagnosis Date  . Anxiety   . GERD (gastroesophageal reflux disease)   . Incomplete right bundle branch block (RBBB) 03/16/2018   noted on EKG  . Morbid obesity ([place])   . Muscle spasm    takes Magnesium  . Sciatica    Left  . Vertigo     Patient Active Problem List   Diagnosis Date Noted  . Obesity 06/28/2013    Past Surgical History:  Procedure Laterality Date  . BREATH TEK H PYLORI N/A 07/29/2013   Procedure: BREATH TEK H PYLORI;  Surgeon: Pedro Earls, MD;  Location: [facility];  Service: General;  Laterality: N/A;  . LAPAROSCOPIC GASTRIC SLEEVE RESECTION N/A 12/18/2018   Procedure: LAPAROSCOPIC GASTRIC SLEEVE RESECTION, UPPER ENDOSCOPY, ERAS PATHWAY;  Surgeon: Kinsinger, [name], MD;  Location: [facility];  Service: General;  Laterality: N/A;     OB History   No obstetric history on file.     Family History  Problem Relation Age of Onset  . Hypertension Other     Social History   Tobacco Use  . Smoking status: Never Smoker  . Smokeless tobacco: Never Used  Substance Use Topics  . Alcohol use: Yes    Comment: occasionally  . Drug use: Yes    Types: Marijuana    Comment: last time 11/2018, very seldom    Home Medications Prior to Admission medications   Medication Sig Start Date End Date Taking? Authorizing  Provider  gabapentin (NEURONTIN) 100 MG capsule Take 2 capsules (200 mg total) by mouth every 12 (twelve) hours. 12/19/18   Kinsinger, [name], MD  Levonorgestrel (SKYLA) 13.5 MG IUD 13.5 mg by Intrauterine route once.     [provider]  Magnesium 250 MG TABS Take by mouth.    [provider]  Multiple Vitamin (MULTIVITAMIN WITH MINERALS) TABS tablet Take 1 tablet by mouth daily.    [provider]  ondansetron (ZOFRAN-ODT) 4 MG disintegrating tablet Take 1 tablet (4 mg total) by mouth every 6 (six) hours as needed for nausea or vomiting. 12/19/18   Kinsinger, [name], MD  pantoprazole (PROTONIX) 40 MG tablet Take 1 tablet (40 mg total) by mouth daily. 12/19/18   Kinsinger, [name], MD  Vitamin E 180 MG CAPS Take 180 mg by mouth daily.    [provider]    Allergies    Patient has no known allergies.  Review of Systems   Review of Systems  All other systems reviewed and are negative.   Physical Exam Updated Vital Signs BP (!) 148/71 (BP Location: Right Arm)   Pulse 89   Temp 98.3 F (36.8 C) (Oral)   Resp 16   Ht 5\' 4"  (1.626 m)  Wt 132.4 kg   SpO2 100%   BMI 50.09 kg/m   Physical Exam Vitals and nursing note reviewed.  Constitutional:      Appearance: She is well-developed.  HENT:     Head: Normocephalic.  Cardiovascular:     Rate and Rhythm: Normal rate.  Pulmonary:     Effort: Pulmonary effort is normal.  Abdominal:     General: There is no distension.  Musculoskeletal:        General: Swelling present. Normal range of motion.     Comments: Slight swelling right calf compared with left.   Skin:    General: Skin is warm.  Neurological:     Mental Status: She is alert and oriented to person, place, and time.  Psychiatric:        Mood and Affect: Mood normal.     ED Results / Procedures / Treatments   Labs (all labs ordered are listed, but only abnormal results are displayed) Labs Reviewed - No data to  display  EKG None  Radiology No results found.  Procedures Procedures (including critical care time)  Medications Ordered in ED Medications - No data to display  ED Course  I have reviewed the triage vital signs and the nursing notes.  Pertinent labs & imaging results that were available during my care of the patient were reviewed by me and considered in my medical decision making (see chart for details).    MDM Rules/Calculators/A&P                      US Doppler no dvt.  Pt advised to return if any problems. Final Clinical Impression(s) / ED Diagnoses Final diagnoses:  None    Rx / DC Orders ED Discharge Orders    None    An After Visit Summary was printed and given to the patient.    Osie Cheeks 12/25/18 1217    Lorre Nick, MD 12/27/18 [phone]

## 2018-12-25 NOTE — ED Triage Notes (Signed)
Pt states she noticed her rt lower leg swollen this am, she has no pain in leg , - Homans sign. Good pulse and only slight swelling noted

## 2019-01-01 ENCOUNTER — Encounter: Payer: BC Managed Care – PPO | Attending: General Surgery | Admitting: Skilled Nursing Facility1

## 2019-01-01 ENCOUNTER — Other Ambulatory Visit: Payer: Self-pay

## 2019-01-01 DIAGNOSIS — E669 Obesity, unspecified: Secondary | ICD-10-CM | POA: Diagnosis not present

## 2019-01-03 NOTE — Progress Notes (Signed)
2 Week Post-Operative Nutrition Class   Patient was seen on 02/27/18 for Post-Operative Nutrition education at the [facility].    Surgery date: 12/18/2018 Surgery type: sleeve Start weight at [facility]: 325.8 Weight today: 289.2   Body Composition Scale Date  Total Body Fat %   Visceral Fat   Fat-Free Mass %    Total Body Water %    Muscle-Mass lbs   Body Fat Displacement          Torso  lbs          Left Leg  lbs          Right Leg  lbs          Left Arm  lbs          Right Arm   lbs      The following the learning objectives were met by the patient during this course:  Identifies Phase 3 (Soft, High Proteins) Dietary Goals and will begin from 2 weeks post-operatively to 2 months post-operatively  Identifies appropriate sources of fluids and proteins   States protein recommendations and appropriate sources post-operatively  Identifies the need for appropriate texture modifications, mastication, and bite sizes when consuming solids  Identifies appropriate multivitamin and calcium sources post-operatively  Describes the need for physical activity post-operatively and will follow MD recommendations  States when to call healthcare provider regarding medication questions or post-operative complications   Handouts given during class include:  Phase 3A: Soft, High Protein Diet Handout   Follow-Up Plan: Patient will follow-up at [facility] in 6 weeks for 2 month post-op nutrition visit for diet advancement per MD.

## 2019-01-08 ENCOUNTER — Telehealth: Payer: Self-pay | Admitting: Skilled Nursing Facility1

## 2019-01-08 NOTE — Telephone Encounter (Signed)
RD called pt to verify fluid intake once starting soft, solid proteins 2 week post-bariatric surgery.   Daily Fluid intake: Daily Protein intake:  Concerns/issues:   LVM 

## 2019-02-12 ENCOUNTER — Other Ambulatory Visit: Payer: Self-pay

## 2019-02-12 ENCOUNTER — Encounter: Payer: Self-pay | Admitting: Dietician

## 2019-02-12 ENCOUNTER — Encounter: Payer: 59 | Attending: General Surgery | Admitting: Dietician

## 2019-02-12 DIAGNOSIS — E669 Obesity, unspecified: Secondary | ICD-10-CM | POA: Diagnosis not present

## 2019-02-12 NOTE — Patient Instructions (Signed)

## 2019-02-12 NOTE — Progress Notes (Signed)
Bariatric Nutrition Follow-Up Visit Medical Nutrition Therapy  Appt Start Time: 4:40pm   End Time: 5:10pm  2 Months Post-Operative Sleeve Gastrectomy Surgery Surgery Date: 12/18/2018  Pt's Expectations of Surgery/ Goals: a tool to further the work she is already doing through diet and exercise, to maintain any weight loss   NUTRITION ASSESSMENT  Anthropometrics  Start weight at [facility]: 325.8 lbs (date: 03/15/2018) Today's weight: 273 lbs  Body Composition Scale 01/01/2019 02/12/2019  Weight  lbs 289.2 273  BMI 51.2 46.9  Total Body Fat  % - -     Visceral Fat - -  Fat-Free Mass  % - -     Total Body Water  % - -     Muscle-Mass  lbs - -  Body Fat Displacement --- ---         Torso  lbs - -         Left Leg  lbs - -         Right Leg  lbs - -         Left Arm  lbs - -         Right Arm  lbs - -    Lifestyle & Dietary Hx Chicken, [place], plant-based sausage, shrimp, fish, cheese, eggs, and yogurt. Typical meal pattern is 3 meals plus maybe 1 snack per day.   24-Hr Dietary Recall First Meal: egg + sausage  Snack: cheese  Second Meal: chicken wings  Snack: -  Third Meal: ground [place] + cheese + sour cream  Snack: - Beverages: water, Gatorade Zero, coffee, matcha tea, protein water, protein shake   Estimated daily fluid intake: 45-60 oz Estimated daily protein intake: 45-55 g Supplements: Bariatric Advantage, calcium  Current average weekly physical activity: 3-4x/week workout videos, resistance, weights, etc.    Post-Op Goals/ Signs/ Symptoms Using straws: yes Drinking while eating: no Chewing/swallowing difficulties: no Changes in vision: no Changes to mood/headaches: no Hair loss/changes to skin/nails: mild hair loss  Difficulty focusing/concentrating: no Sweating: no Dizziness/lightheadedness: no Palpitations: no  Carbonated/caffeinated beverages: no (yes caffeine)  N/V/D/C/Gas: nausea, constipation  Abdominal pain: no Dumping syndrome: no   NUTRITION  DIAGNOSIS  Overweight/obesity ([place]-3.3) related to past poor dietary habits and physical inactivity as evidenced by completed bariatric surgery and following dietary guidelines for continued weight loss and healthy nutrition status.   NUTRITION INTERVENTION Nutrition counseling (C-1) and education (E-2) to facilitate bariatric surgery goals, including: . Diet advancement to the next phase (phase 4) now including non-starchy vegetables . The importance of consuming adequate calories as well as certain nutrients daily due to the body's need for essential vitamins, minerals, and fats . The importance of daily physical activity and to reach a goal of at least 150 minutes of moderate to vigorous physical activity weekly (or as directed by their physician) due to benefits such as increased musculature and improved lab values  Handouts Provided Include   Phase 4: Protein + Non-Starchy Vegetables   Learning Style & Readiness for Change Teaching method utilized: Visual & Auditory  Demonstrated degree of understanding via: Teach Back  Barriers to learning/adherence to lifestyle change: None Identified    MONITORING & EVALUATION Dietary intake, weekly physical activity, body weight, and goals in 4 months.  Next Steps Patient is to follow-up in 4 months for 6 month post-op follow-up.

## 2019-03-22 ENCOUNTER — Ambulatory Visit: Payer: 59

## 2019-03-22 ENCOUNTER — Ambulatory Visit: Payer: 59 | Attending: Internal Medicine

## 2019-03-22 DIAGNOSIS — Z23 Encounter for immunization: Secondary | ICD-10-CM

## 2019-03-22 NOTE — Progress Notes (Signed)
   Covid-19 Vaccination Clinic  Name:  Amanda Ruiz    MRN: [id number] DOB: Aug 19, 1985  03/22/2019  [name] was observed post Covid-19 immunization for 15 minutes without incident. She was provided with Vaccine Information Sheet and instruction to access the V-Safe system.   [name] was instructed to call 911 with any severe reactions post vaccine: Marland Kitchen Difficulty breathing  . Swelling of face and throat  . A fast heartbeat  . A bad rash all over body  . Dizziness and weakness   Immunizations Administered    Name Date Dose VIS Date Route   Pfizer COVID-19 Vaccine 03/22/2019  9:35 AM 0.3 mL 12/14/2018 Intramuscular   Manufacturer: [employer], [employer]   Lot: [id number]   NDC: [address]-7

## 2019-04-16 ENCOUNTER — Ambulatory Visit: Payer: 59 | Attending: Internal Medicine

## 2019-04-16 DIAGNOSIS — Z23 Encounter for immunization: Secondary | ICD-10-CM

## 2019-04-16 NOTE — Progress Notes (Signed)
   Covid-19 Vaccination Clinic  Name:  Kashvi Prevette    MRN: [id number] DOB: [birth year]  04/16/2019  Ms. Awtrey was observed post Covid-19 immunization for 15 minutes without incident. She was provided with Vaccine Information Sheet and instruction to access the V-Safe system.   Ms. Supak was instructed to call 911 with any severe reactions post vaccine: Marland Kitchen Difficulty breathing  . Swelling of face and throat  . A fast heartbeat  . A bad rash all over body  . Dizziness and weakness   Immunizations Administered    Name Date Dose VIS Date Route   Pfizer COVID-19 Vaccine 04/16/2019 10:24 AM 0.3 mL 12/14/2018 Intramuscular   Manufacturer: [employer], [employer]   Lot: [id number]   NDC: [address]-4

## 2019-06-11 ENCOUNTER — Other Ambulatory Visit: Payer: Self-pay

## 2019-06-11 ENCOUNTER — Encounter: Payer: No Typology Code available for payment source | Attending: General Surgery | Admitting: Skilled Nursing Facility1

## 2019-06-11 DIAGNOSIS — E669 Obesity, unspecified: Secondary | ICD-10-CM | POA: Insufficient documentation

## 2019-06-13 NOTE — Progress Notes (Signed)
Follow-up visit:  Post-Operative sleeve Surgery  Medical Nutrition Therapy:  Appt start time: 6:00pm end time:  7:00pm  Primary concerns today: Post-operative Bariatric Surgery Nutrition Management 6 Month Post-Op Class  Surgery date: 12/18/2018 Surgery type: sleeve Start weight at [facility]: 325.8 Weight today: 237.3   Body Composition Scale Declined  Weight  lbs   Total Body Fat  %      Visceral Fat   Fat-Free Mass  %      Total Body Water  %      Muscle-Mass  lbs   BMI   Body Fat Displacement ---        Torso  lbs         Left Leg  lbs         Right Leg  lbs         Left Arm  lbs         Right Arm  lbs      Information Reviewed/ Discussed During Appointment: -Review of composition scale numbers -Fluid requirements (64-100 ounces) -Protein requirements (60-80g) -Strategies for tolerating diet -Advancement of diet to include Starchy vegetables -Barriers to inclusion of new foods -Inclusion of appropriate multivitamin and calcium supplements  -Exercise recommendations   Fluid intake: adequate   Medications: See List Supplementation: appropriate   Using straws: no Drinking while eating: no Having you been chewing well: yes Chewing/swallowing difficulties: no Changes in vision: no Changes to mood/headaches: no Hair loss/Cahnges to skin/Changes to nails: no Any difficulty focusing or concentrating: no Sweating: no Dizziness/Lightheaded: no Palpitations: no  Carbonated beverages: no N/V/D/C/GAS: no Abdominal Pain: no Dumping syndrome: no  Recent physical activity:  ADL's  Progress Towards Goal(s):  In Progress  Handouts given during visit include:  Phase V diet Progression   Goals Sheet  The Benefits of Exercise are endless.....  Support Group Topics  Pt Chosen Goals:  I will drink 64 ounces of any sugar free, carbonation free option 7 days a week by (specific date) ___09_____  Physical Activity Goals: I will (type of movement)  ____workout_____________________ for (hours/minutes) ___30_______, for (how many days a week) ________________5__________ by (specific date) ________09________  Teaching Method Utilized:  Visual Auditory Hands on  Demonstrated degree of understanding via:  Teach Back   Monitoring/Evaluation:  Dietary intake, exercise, and body weight. Follow up in 3 months for 9 month post-op visit.

## 2019-09-12 ENCOUNTER — Ambulatory Visit: Payer: No Typology Code available for payment source | Admitting: Skilled Nursing Facility1

## 2021-02-28 IMAGING — RF UPPER GI SERIES (WITHOUT KUB)
13 series · 13 of 13 positions shown · non-contrast
Comparison: None.

CLINICAL DATA: Morbid obesity. Pre-op evaluation for bariatric
surgery.

EXAM:
UPPER GI SERIES WITH KUB
TECHNIQUE: After obtaining a scout radiograph a routine upper GI series was
performed using thin barium.
FLUOROSCOPY TIME:  Fluoroscopy Time:  2 minutes 42 seconds
Radiation Exposure Index (if provided by the fluoroscopic device):
84.8 mGy
Number of Acquired Spot Images: 0

[Series 1: t abdomen supine · 0.15mm/px · 1 of 1 slices shown]
[im 1/1]
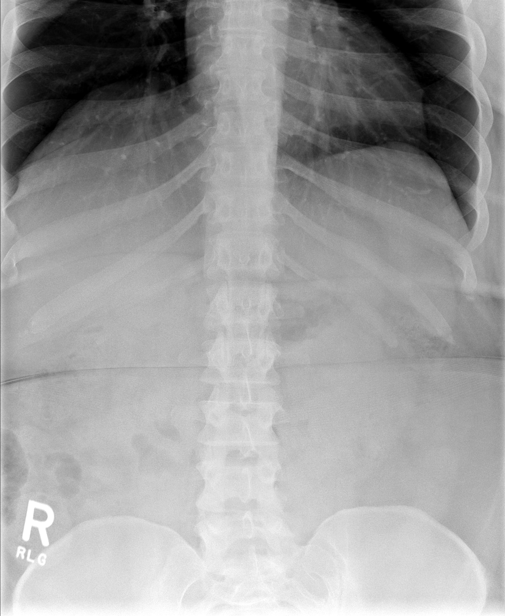

[Series 2: cp_standard · 0.27mm/px · 1 of 1 slices shown (1 of 12)]
[im 1/1]
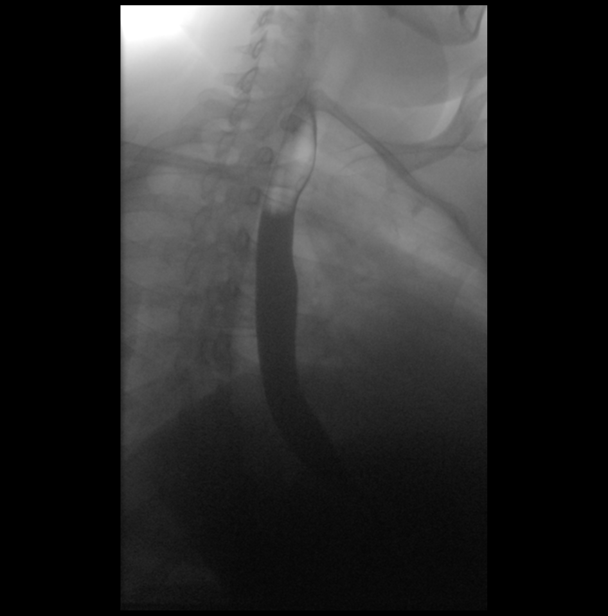

[Series 3: cp_standard · 0.27mm/px · 1 of 1 slices shown (2 of 12)]
[im 1/1]
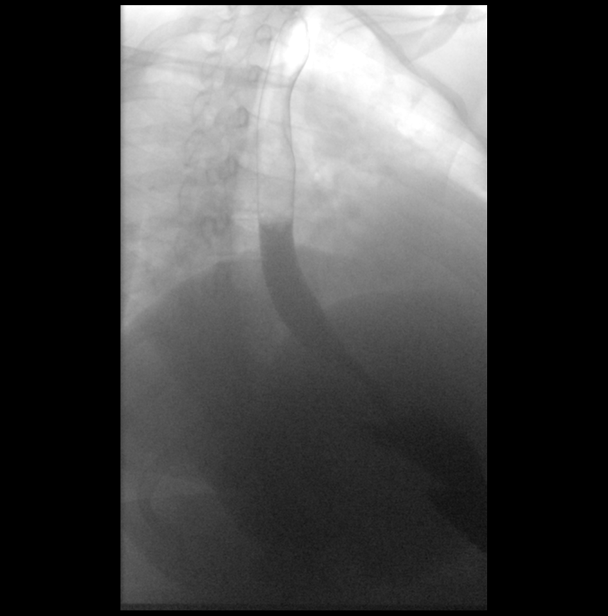

[Series 4: cp_standard · 0.27mm/px · 1 of 1 slices shown (3 of 12)]
[im 1/1]
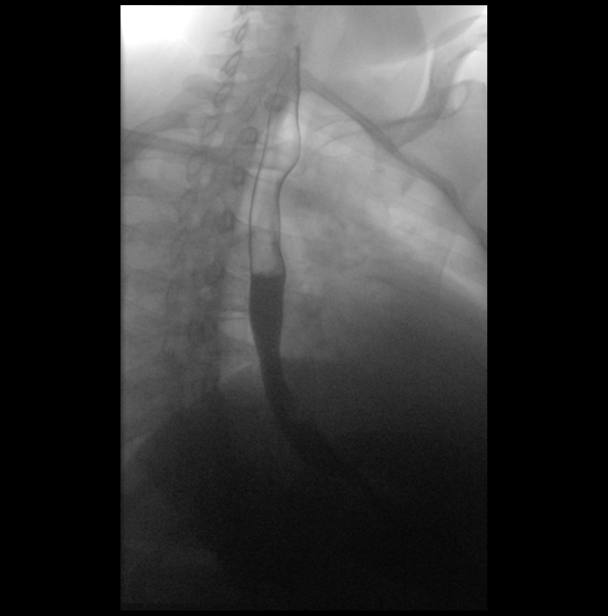

[Series 5: cp_standard · 0.30mm/px · 1 of 1 slices shown (4 of 12)]
[im 1/1]
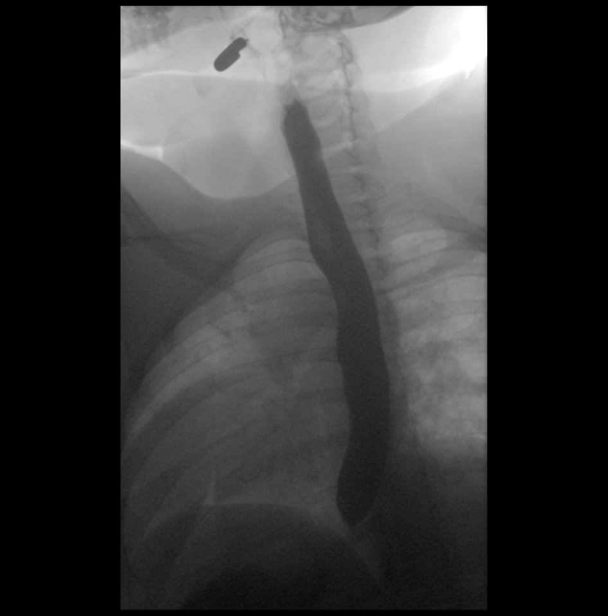

[Series 6: cp_standard · 0.30mm/px · 1 of 1 slices shown (5 of 12)]
[im 1/1]
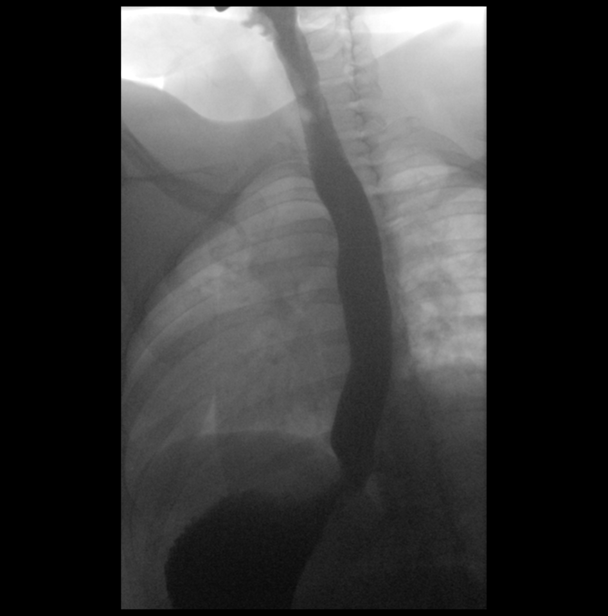

[Series 7: cp_standard · 0.30mm/px · 1 of 1 slices shown (6 of 12)]
[im 1/1]
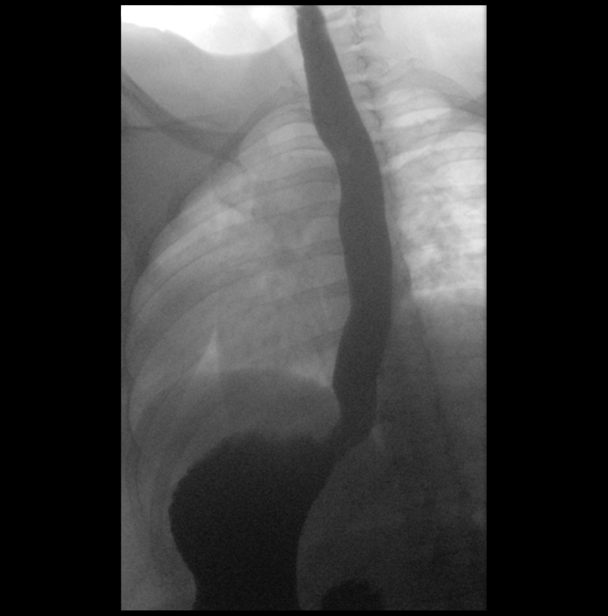

[Series 8: cp_standard · 0.20mm/px · 1 of 1 slices shown (7 of 12)]
[im 1/1]
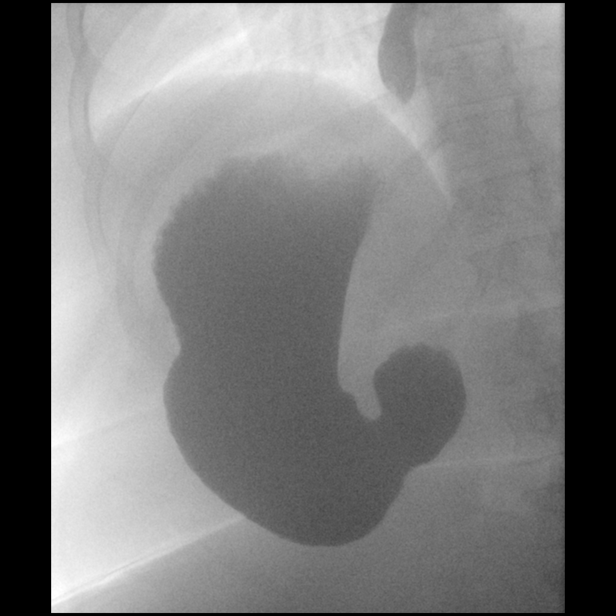

[Series 9: cp_standard · 0.20mm/px · 1 of 1 slices shown (8 of 12)]
[im 1/1]
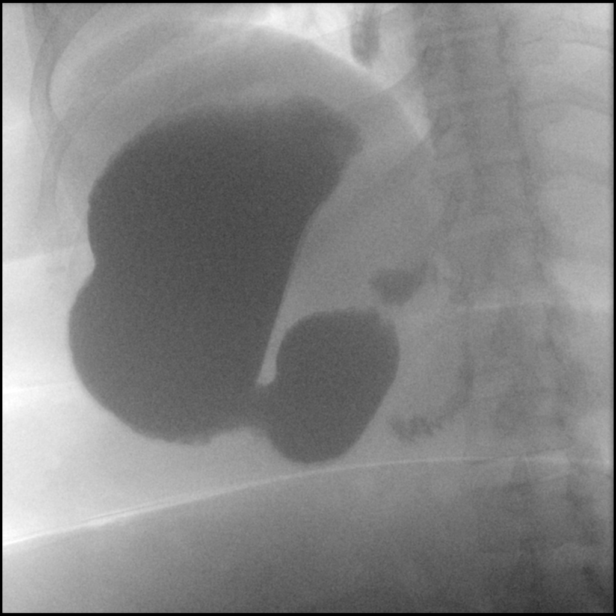

[Series 10: cp_standard · 0.20mm/px · 1 of 1 slices shown (9 of 12)]
[im 1/1]
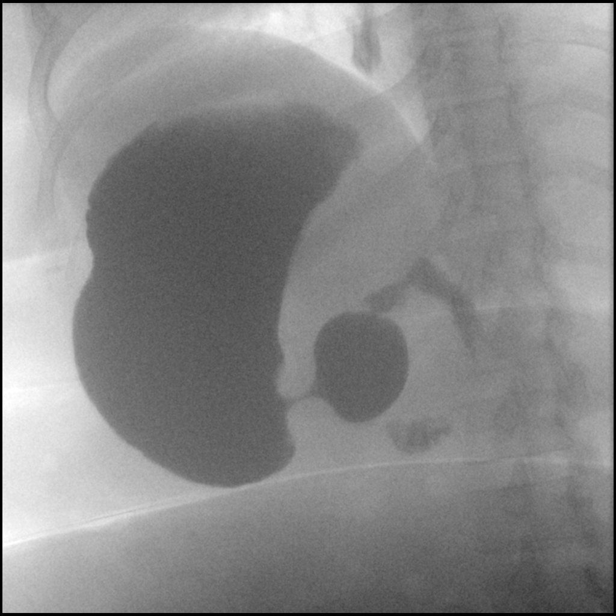

[Series 11: cp_standard · 0.19mm/px · 1 of 1 slices shown (10 of 12)]
[im 1/1]
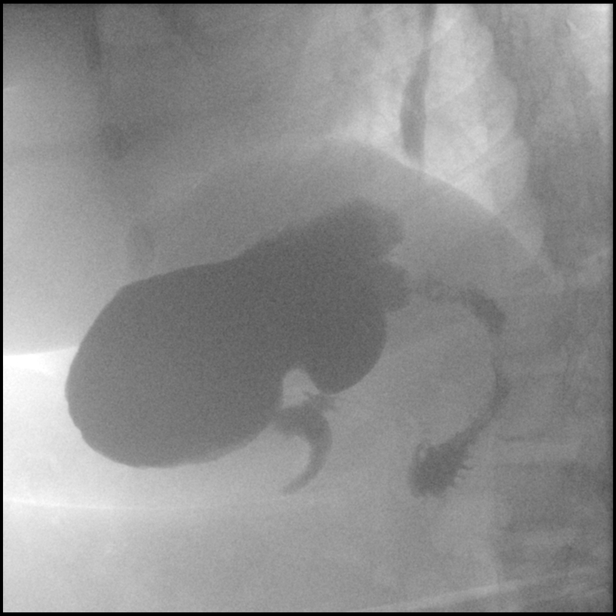

[Series 12: cp_standard · 0.19mm/px · 1 of 1 slices shown (11 of 12)]
[im 1/1]
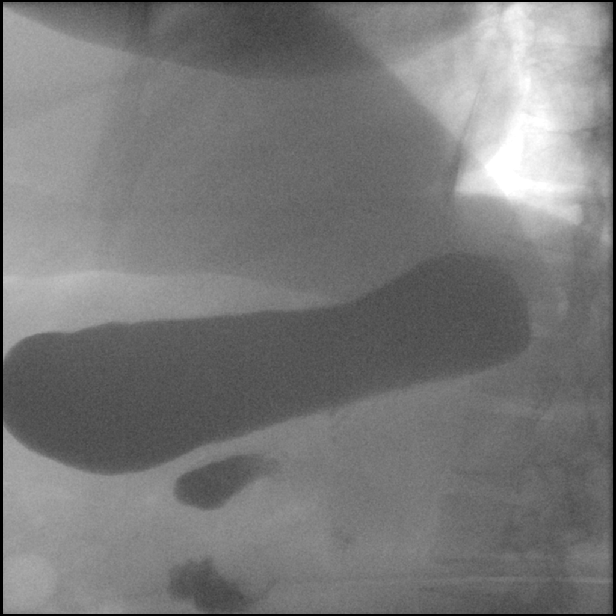

[Series 13: cp_standard · 0.21mm/px · 1 of 1 slices shown (12 of 12)]
[im 1/1]
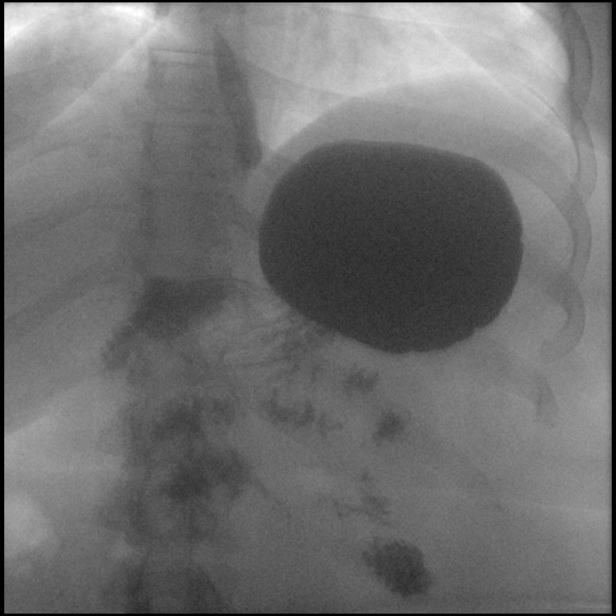

[13 of 13 positions shown; findings below may reference images not displayed]

FINDINGS: Scout radiograph:  Unremarkable bowel gas pattern.

Esophagus: No evidence of esophageal mass or stricture. Mild
esophageal dysmotility is seen with parenchyma primary peristalsis
causing esophageal stasis. No gastroesophageal reflux observed.

Stomach: No hiatal hernia visualized. No evidence of gastric mass or
ulcer.

Duodenum: No ulcer or other significant abnormality seen involving
duodenal bulb or sweep.

Other:  None.
IMPRESSION: Mild esophageal dysmotility. Otherwise negative exam.

## 2021-07-22 ENCOUNTER — Encounter (HOSPITAL_COMMUNITY): Payer: Self-pay | Admitting: *Deleted

## 2022-07-28 ENCOUNTER — Encounter (HOSPITAL_COMMUNITY): Payer: Self-pay | Admitting: *Deleted

## 2022-10-28 ENCOUNTER — Ambulatory Visit
Admission: EM | Admit: 2022-10-28 | Discharge: 2022-10-28 | Disposition: A | Discharge status: Home / Self Care | Payer: BC Managed Care – PPO | Attending: Internal Medicine | Admitting: Internal Medicine

## 2022-10-28 DIAGNOSIS — J189 Pneumonia, unspecified organism: Secondary | ICD-10-CM | POA: Diagnosis not present

## 2022-10-28 MED ORDER — CETIRIZINE HCL 10 MG PO TABS: 10.0000 mg | ORAL_TABLET | Freq: Every day | ORAL | 0 refills | Status: AC

## 2022-10-28 MED ORDER — PROMETHAZINE-DM 6.25-15 MG/5ML PO SYRP
5.0000 mL | ORAL_SOLUTION | Freq: Three times a day (TID) | ORAL | 0 refills | Status: DC | PRN
Start: 2022-10-28 — End: 2023-05-10

## 2022-10-28 MED ORDER — AMOXICILLIN-POT CLAVULANATE 875-125 MG PO TABS: 1.0000 | ORAL_TABLET | Freq: Two times a day (BID) | ORAL | 0 refills | Status: DC

## 2022-10-28 MED ORDER — AZITHROMYCIN 250 MG PO TABS: ORAL_TABLET | ORAL | 0 refills | Status: DC

## 2022-10-28 NOTE — ED Triage Notes (Signed)
Pt c/o cough, runny nose started 1 week ago-c/o cont'd cough that is prod x 4 days -NAD-steady gait

## 2022-10-28 NOTE — ED Provider Notes (Signed)
[facility]  Note:  This document was prepared using Conservation officer, historic buildings and may include unintentional dictation errors.  MRN: [id number] DOB: 07/04/1985  Subjective:   Amanda Ruiz is a 37 y.o. female presenting for 1 week history of acute onset persistent and worsening runny and stuffy nose now having a productive cough with rattling in her chest.  Has felt fatigued.  No history of asthma.  No smoking of any kind including cigarettes, cigars, vaping, marijuana use.    No current facility-administered medications for this encounter.  Current Outpatient Medications:    gabapentin (NEURONTIN) 100 MG capsule, Take 2 capsules (200 mg total) by mouth every 12 (twelve) hours., Disp: 20 capsule, Rfl: 0   Levonorgestrel (SKYLA) 13.5 MG IUD, 13.5 mg by Intrauterine route once. , Disp: , Rfl:    Magnesium 250 MG TABS, Take by mouth., Disp: , Rfl:    Multiple Vitamin (MULTIVITAMIN WITH MINERALS) TABS tablet, Take 1 tablet by mouth daily., Disp: , Rfl:    ondansetron (ZOFRAN-ODT) 4 MG disintegrating tablet, Take 1 tablet (4 mg total) by mouth every 6 (six) hours as needed for nausea or vomiting., Disp: 20 tablet, Rfl: 0   pantoprazole (PROTONIX) 40 MG tablet, Take 1 tablet (40 mg total) by mouth daily., Disp: 90 tablet, Rfl: 0   Vitamin E 180 MG CAPS, Take 180 mg by mouth daily., Disp: , Rfl:    No Known Allergies  Past Medical History:  Diagnosis Date   Anxiety    GERD (gastroesophageal reflux disease)    Incomplete right bundle branch block (RBBB) 03/16/2018   noted on EKG   Morbid obesity ([facility])    Muscle spasm    takes Magnesium   Sciatica    Left   Vertigo      Past Surgical History:  Procedure Laterality Date   BREATH TEK H PYLORI N/A 07/29/2013   Procedure: BREATH TEK H PYLORI;  Surgeon: Valarie Merino, MD;  Location: [facility];  Service: General;  Laterality: N/A;   LAPAROSCOPIC GASTRIC SLEEVE RESECTION N/A 12/18/2018    Procedure: LAPAROSCOPIC GASTRIC SLEEVE RESECTION, UPPER ENDOSCOPY, ERAS PATHWAY;  Surgeon: Kinsinger, [name], MD;  Location: [facility];  Service: General;  Laterality: N/A;    Family History  Problem Relation Age of Onset   Hypertension Other     Social History   Tobacco Use   Smoking status: Never   Smokeless tobacco: Never  Vaping Use   Vaping status: Never Used  Substance Use Topics   Alcohol use: Yes    Comment: occasionally   Drug use: Not Currently    Types: Marijuana    ROS   Objective:   Vitals: BP 125/82 (BP Location: Right Arm)   Pulse 70   Temp 98.1 F (36.7 C) (Oral)   Resp 20   LMP 10/05/2022   SpO2 98%   Physical Exam Constitutional:      General: She is not in acute distress.    Appearance: Normal appearance. She is well-developed. She is not ill-appearing, toxic-appearing or diaphoretic.  HENT:     Head: Normocephalic and atraumatic.     Nose: Nose normal.     Mouth/Throat:     Mouth: Mucous membranes are moist.  Eyes:     General: No scleral icterus.       Right eye: No discharge.        Left eye: No discharge.     Extraocular Movements: Extraocular movements intact.  Cardiovascular:     Rate and Rhythm: Normal rate and regular rhythm.     Heart sounds: Normal heart sounds. No murmur heard.    No friction rub. No gallop.  Pulmonary:     Effort: Pulmonary effort is normal. No respiratory distress.     Breath sounds: No stridor. Examination of the left-middle field reveals rhonchi. Examination of the left-lower field reveals rhonchi. Rhonchi and rales present. No wheezing.  Chest:     Chest wall: No tenderness.  Skin:    General: Skin is warm and dry.  Neurological:     General: No focal deficit present.     Mental Status: She is alert and oriented to person, place, and time.  Psychiatric:        Mood and Affect: Mood normal.        Behavior: Behavior normal.     Assessment and Plan :   PDMP not reviewed this encounter.  1.  Pneumonia of left lower lobe due to infectious organism    Both patient and I agreed to defer imaging.  Physical exam findings consistent with community-acquired pneumonia of the left mid to lower lobe.  Will use double coverage with Augmentin and azithromycin.  Recommended supportive care otherwise.  Counseled patient on potential for adverse effects with medications prescribed/recommended today, ER and return-to-clinic precautions discussed, patient verbalized understanding.    Wallis Bamberg, PA-C 10/28/22 1740

## 2022-10-28 NOTE — Discharge Instructions (Signed)
Please start amoxicillin-clavulanate and azithromycin as antibiotics to help with your pneumonia. Otherwise, use Tylenol, ibuprofen for aches and pains, fevers. Get plenty of fluids and rest.

## 2022-12-26 ENCOUNTER — Other Ambulatory Visit: Payer: Self-pay | Admitting: Obstetrics and Gynecology

## 2022-12-26 ENCOUNTER — Other Ambulatory Visit (HOSPITAL_COMMUNITY)
Admission: RE | Admit: 2022-12-26 | Discharge: 2022-12-26 | Disposition: A | Discharge status: Home / Self Care | Payer: BC Managed Care – PPO | Source: Ambulatory Visit | Attending: Obstetrics and Gynecology | Admitting: Obstetrics and Gynecology

## 2022-12-26 DIAGNOSIS — Z01419 Encounter for gynecological examination (general) (routine) without abnormal findings: Secondary | ICD-10-CM | POA: Diagnosis present

## 2022-12-29 LAB — CYTOLOGY - PAP
Comment: NEGATIVE
Diagnosis: NEGATIVE
High risk HPV: NEGATIVE

## 2023-03-08 LAB — TSH: TSH: 1.31 (ref 0.41–5.90)

## 2023-05-10 ENCOUNTER — Ambulatory Visit (INDEPENDENT_AMBULATORY_CARE_PROVIDER_SITE_OTHER): Admitting: Family Medicine

## 2023-05-10 ENCOUNTER — Encounter: Payer: Self-pay | Admitting: Family Medicine

## 2023-05-10 VITALS — BP 128/78 | HR 83 | Temp 97.8°F | Ht 64.0 in | Wt 249.6 lb

## 2023-05-10 DIAGNOSIS — Z7689 Persons encountering health services in other specified circumstances: Secondary | ICD-10-CM

## 2023-05-10 DIAGNOSIS — E66813 Obesity, class 3: Secondary | ICD-10-CM

## 2023-05-10 DIAGNOSIS — R002 Palpitations: Secondary | ICD-10-CM | POA: Diagnosis not present

## 2023-05-10 DIAGNOSIS — Z6841 Body Mass Index (BMI) 40.0 and over, adult: Secondary | ICD-10-CM

## 2023-05-10 DIAGNOSIS — Z903 Acquired absence of stomach [part of]: Secondary | ICD-10-CM | POA: Diagnosis not present

## 2023-05-10 DIAGNOSIS — F3281 Premenstrual dysphoric disorder: Secondary | ICD-10-CM | POA: Diagnosis not present

## 2023-05-10 NOTE — Progress Notes (Signed)
 Established Patient Office Visit   Subjective  Patient ID: Amanda Ruiz, female    DOB: 1985-01-20  Age: 38 y.o. MRN: [id number]  Chief Complaint  Patient presents with   New Patient (Initial Visit)  Pt's name pronounced "Jasmine".  Patient is a 38 year old female seen for establish care and follow-up on ongoing concerns.  Has not had PCP in a while.  Followed by OB/GYN.  History of anxiety and depression: States anxiety diagnosed in 2022 and depression in 2023.  Was on Effexor for 1 year.  Now in therapy biweekly.  Obesity: Patient status post gastric sleeve.  Lost 120 lbs after surgery.  Has since gained back 40 lbs.  Working with a Psychologist, educational twice a week for [employer] training.  Trying to move more throughout the week.  Taking multivitamin daily.  Hormone concerns: Patient followed by OB/GYN, Dr. Wynona Hedger.  Patient notes episodes of palpitations, heart feels like it is racing and drastic mood changes several days prior to menses.  Recently started on OCPs but unsure if has made a difference in symptoms.  Has follow-up with OB/GYN to discuss further.  Patient cut down caffeine intake which may have made a difference in palpitations.  Enjoys eating spicy foods but has not paid attention to correlation of symptoms and foods.  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  Patient endorses social alcohol use.  Patient endorses rare MJ use.  Patient denies tobacco use.     Patient Active Problem List   Diagnosis Date Noted   Obesity 06/28/2013   Past Medical History:  Diagnosis Date   Anxiety    Depression    GERD (gastroesophageal reflux disease)    Incomplete right bundle branch block (RBBB) 03/16/2018   noted on EKG   Morbid obesity (HCC)    Muscle spasm    takes Magnesium   Sciatica    Left   Vertigo    Past Surgical History:  Procedure Laterality Date   BREATH TEK H PYLORI N/A 07/29/2013    Procedure: BREATH TEK H PYLORI;  Surgeon: Azucena Bollard, MD;  Location: [facility];  Service: General;  Laterality: N/A;   LAPAROSCOPIC GASTRIC SLEEVE RESECTION N/A 12/18/2018   Procedure: LAPAROSCOPIC GASTRIC SLEEVE RESECTION, UPPER ENDOSCOPY, ERAS PATHWAY;  Surgeon: Kinsinger, Alphonso Aschoff, MD;  Location: [facility];  Service: General;  Laterality: N/A;   Social History   Tobacco Use   Smoking status: Never   Smokeless tobacco: Never  Vaping Use   Vaping status: Never Used  Substance Use Topics   Alcohol use: Yes    Comment: occasionally   Drug use: Not Currently    Types: Marijuana   Family History  Problem Relation Age of Onset   Hypertension Other    Hypertension Mother    Obesity Mother    Stroke Mother    Anxiety disorder Brother    Hypertension Brother    Obesity Brother    No Known Allergies    ROS Negative unless stated above    Objective:     BP 128/78 (BP Location: Left Arm, Patient Position: Sitting, Cuff Size: Normal)   Pulse 83   Temp 97.8 F (36.6 C) (Oral)   Ht 5\' 4"  (1.626 m)   Wt 249 lb 9.6 oz (113.2 kg)   LMP 04/20/2023 (Exact Date)   SpO2 99%   BMI 42.84 kg/m  BP Readings from Last 3 Encounters:  05/10/23  128/78  10/28/22 125/82  12/25/18 (!) 148/71   Wt Readings from Last 3 Encounters:  05/10/23 249 lb 9.6 oz (113.2 kg)  06/13/19 237 lb 4.8 oz (107.6 kg)  02/12/19 273 lb (123.8 kg)      Physical Exam Constitutional:      General: She is not in acute distress.    Appearance: Normal appearance.  HENT:     Head: Normocephalic and atraumatic.     Nose: Nose normal.     Mouth/Throat:     Mouth: Mucous membranes are moist.  Cardiovascular:     Rate and Rhythm: Normal rate and regular rhythm.     Heart sounds: Normal heart sounds. No murmur heard.    No gallop.  Pulmonary:     Effort: Pulmonary effort is normal. No respiratory distress.     Breath sounds: Normal breath sounds. No wheezing, rhonchi or rales.  Skin:     General: Skin is warm and dry.  Neurological:     Mental Status: She is alert and oriented to person, place, and time.        05/10/2023    9:33 AM 03/15/2018    2:33 PM  Depression screen PHQ 2/9  Decreased Interest 0 0  Down, Depressed, Hopeless 0 0  PHQ - 2 Score 0 0  Altered sleeping 1   Tired, decreased energy 0   Change in appetite 1   Feeling bad or failure about yourself  0   Trouble concentrating 0   Moving slowly or fidgety/restless 0   Suicidal thoughts 0   PHQ-9 Score 2   Difficult doing work/chores Not difficult at all       05/10/2023    9:33 AM  GAD 7 : Generalized Anxiety Score  Nervous, Anxious, on Edge 1  Control/stop worrying 1  Worry too much - different things 0  Trouble relaxing 0  Restless 0  Easily annoyed or irritable 0  Afraid - awful might happen 0  Total GAD 7 Score 2  Anxiety Difficulty Not difficult at all     No results found for any visits on 05/10/23.    Assessment & Plan:  PMDD (premenstrual dysphoric disorder)  Palpitations  S/P gastric sleeve procedure  Encounter to establish care  Class 3 severe obesity with body mass index (BMI) of 40.0 to 44.9 in adult, unspecified obesity type, unspecified whether serious comorbidity present  Patient with symptoms of including mood swings, palpitations prior to start of menses.  Likely associated with PMDD.  Discussed treatment options.  Patient to follow-up with OB/GYN to reassess.  Advised to keep a food diary to see if there are any correlations with the palpitations.  Cut down on caffeine and spicy food intake.  Continue multivitamin status post gastric sleeve procedure.  Current BMI 42.84 kg/m.  Continue lifestyle modifications.  Return if symptoms worsen or fail to improve, for physical.   Viola Greulich, MD

## 2023-05-10 NOTE — Patient Instructions (Addendum)
 It was nice meeting you today.  You can set up an appointment for your physical at your earliest convenience.

## 2023-05-17 ENCOUNTER — Ambulatory Visit (INDEPENDENT_AMBULATORY_CARE_PROVIDER_SITE_OTHER): Admitting: Family Medicine

## 2023-05-17 ENCOUNTER — Encounter: Payer: Self-pay | Admitting: Family Medicine

## 2023-05-17 VITALS — BP 118/74 | HR 80 | Temp 97.8°F | Ht 64.0 in | Wt 251.2 lb

## 2023-05-17 DIAGNOSIS — Z1159 Encounter for screening for other viral diseases: Secondary | ICD-10-CM

## 2023-05-17 DIAGNOSIS — Z6841 Body Mass Index (BMI) 40.0 and over, adult: Secondary | ICD-10-CM

## 2023-05-17 DIAGNOSIS — Z131 Encounter for screening for diabetes mellitus: Secondary | ICD-10-CM

## 2023-05-17 DIAGNOSIS — Z903 Acquired absence of stomach [part of]: Secondary | ICD-10-CM | POA: Diagnosis not present

## 2023-05-17 DIAGNOSIS — Z113 Encounter for screening for infections with a predominantly sexual mode of transmission: Secondary | ICD-10-CM

## 2023-05-17 DIAGNOSIS — Z0000 Encounter for general adult medical examination without abnormal findings: Secondary | ICD-10-CM | POA: Diagnosis not present

## 2023-05-17 DIAGNOSIS — E66813 Obesity, class 3: Secondary | ICD-10-CM

## 2023-05-17 LAB — VITAMIN B12: Vitamin B-12: 1309 pg/mL — ABNORMAL HIGH (ref 211–911)

## 2023-05-17 LAB — CBC WITH DIFFERENTIAL/PLATELET
Basophils Absolute: 0 10*3/uL (ref 0.0–0.1)
Basophils Relative: 0.4 % (ref 0.0–3.0)
Eosinophils Absolute: 0.1 10*3/uL (ref 0.0–0.7)
Eosinophils Relative: 1.2 % (ref 0.0–5.0)
HCT: 37.5 % (ref 36.0–46.0)
Hemoglobin: 12.2 g/dL (ref 12.0–15.0)
Lymphocytes Relative: 31.7 % (ref 12.0–46.0)
Lymphs Abs: 1.6 10*3/uL (ref 0.7–4.0)
MCHC: 32.5 g/dL (ref 30.0–36.0)
MCV: 81.8 fl (ref 78.0–100.0)
Monocytes Absolute: 0.3 10*3/uL (ref 0.1–1.0)
Monocytes Relative: 4.8 % (ref 3.0–12.0)
Neutro Abs: 3.2 10*3/uL (ref 1.4–7.7)
Neutrophils Relative %: 61.9 % (ref 43.0–77.0)
Platelets: 250 10*3/uL (ref 150.0–400.0)
RBC: 4.58 Mil/uL (ref 3.87–5.11)
RDW: 13.5 % (ref 11.5–15.5)
WBC: 5.2 10*3/uL (ref 4.0–10.5)

## 2023-05-17 LAB — HEMOGLOBIN A1C: Hgb A1c MFr Bld: 5.5 % (ref 4.6–6.5)

## 2023-05-17 LAB — COMPREHENSIVE METABOLIC PANEL WITH GFR
ALT: 14 U/L (ref 0–35)
AST: 15 U/L (ref 0–37)
Albumin: 4.3 g/dL (ref 3.5–5.2)
Alkaline Phosphatase: 61 U/L (ref 39–117)
BUN: 14 mg/dL (ref 6–23)
CO2: 26 meq/L (ref 19–32)
Calcium: 9.8 mg/dL (ref 8.4–10.5)
Chloride: 106 meq/L (ref 96–112)
Creatinine, Ser: 0.84 mg/dL (ref 0.40–1.20)
GFR: 89.32 mL/min (ref >60.00)
Glucose, Bld: 79 mg/dL (ref 70–99)
Potassium: 4.1 meq/L (ref 3.5–5.1)
Sodium: 139 meq/L (ref 135–145)
Total Bilirubin: 0.3 mg/dL (ref 0.2–1.2)
Total Protein: 7.8 g/dL (ref 6.0–8.3)

## 2023-05-17 LAB — LIPID PANEL
Cholesterol: 228 mg/dL — ABNORMAL HIGH (ref 0–200)
HDL: 88.3 mg/dL (ref >39.00)
LDL Cholesterol: 124 mg/dL — ABNORMAL HIGH (ref 0–99)
NonHDL: 139.4
Total CHOL/HDL Ratio: 3
Triglycerides: 75 mg/dL (ref 0.0–149.0)
VLDL: 15 mg/dL (ref 0.0–40.0)

## 2023-05-17 LAB — TSH: TSH: 1.25 u[IU]/mL (ref 0.35–5.50)

## 2023-05-17 LAB — VITAMIN D 25 HYDROXY (VIT D DEFICIENCY, FRACTURES): VITD: 47.63 ng/mL (ref 30.00–100.00)

## 2023-05-17 LAB — T4, FREE: Free T4: 0.72 ng/dL (ref 0.60–1.60)

## 2023-05-17 NOTE — Progress Notes (Signed)
 Established Patient Office Visit   Subjective  Patient ID: Amanda Ruiz, female    DOB: 17-Dec-1985  Age: 38 y.o. MRN: [id number]  Chief Complaint  Patient presents with   Annual Exam    Patient is a 38 year old female seen for CPE.  Patient states she has been doing well since last visit a week or so ago.    Patient Active Problem List   Diagnosis Date Noted   Obesity 06/28/2013   Past Medical History:  Diagnosis Date   Anxiety    Depression    GERD (gastroesophageal reflux disease)    Incomplete right bundle branch block (RBBB) 03/16/2018   noted on EKG   Morbid obesity (HCC)    Muscle spasm    takes Magnesium   Sciatica    Left   Vertigo    Past Surgical History:  Procedure Laterality Date   BREATH TEK H PYLORI N/A 07/29/2013   Procedure: BREATH TEK H PYLORI;  Surgeon: Azucena Bollard, MD;  Location: [facility];  Service: General;  Laterality: N/A;   LAPAROSCOPIC GASTRIC SLEEVE RESECTION N/A 12/18/2018   Procedure: LAPAROSCOPIC GASTRIC SLEEVE RESECTION, UPPER ENDOSCOPY, ERAS PATHWAY;  Surgeon: Kinsinger, Alphonso Aschoff, MD;  Location: [facility];  Service: General;  Laterality: N/A;   Social History   Tobacco Use   Smoking status: Never   Smokeless tobacco: Never  Vaping Use   Vaping status: Never Used  Substance Use Topics   Alcohol use: Yes    Comment: occasionally   Drug use: Yes    Types: Marijuana   Family History  Problem Relation Age of Onset   Hypertension Other    Hypertension Mother    Obesity Mother    Stroke Mother    Anxiety disorder Brother    Hypertension Brother    Obesity Brother    No Known Allergies  ROS Negative unless stated above    Objective:      BP 118/74 (BP Location: Left Arm, Patient Position: Sitting, Cuff Size: Large)   Pulse 80   Temp 97.8 F (36.6 C) (Oral)   Ht 5\' 4"  (1.626 m)   Wt 251 lb 3.2 oz (113.9 kg)   LMP 04/20/2023 (Exact Date)   SpO2 95%   BMI 43.12 kg/m  BP Readings from Last 3 Encounters:   05/17/23 118/74  05/10/23 128/78  10/28/22 125/82   Wt Readings from Last 3 Encounters:  05/17/23 251 lb 3.2 oz (113.9 kg)  05/10/23 249 lb 9.6 oz (113.2 kg)  06/13/19 237 lb 4.8 oz (107.6 kg)      Physical Exam Constitutional:      Appearance: Normal appearance.  HENT:     Head: Normocephalic and atraumatic.     Right Ear: Tympanic membrane, ear canal and external ear normal.     Left Ear: Tympanic membrane, ear canal and external ear normal.     Nose: Nose normal.     Mouth/Throat:     Mouth: Mucous membranes are moist.     Pharynx: No oropharyngeal exudate or posterior oropharyngeal erythema.  Eyes:     General: No scleral icterus.    Extraocular Movements: Extraocular movements intact.     Conjunctiva/sclera: Conjunctivae normal.     Pupils: Pupils are equal, round, and reactive to light.  Neck:     Thyroid : No thyromegaly.  Cardiovascular:     Rate and Rhythm: Normal rate and regular rhythm.     Pulses: Normal pulses.     Heart sounds:  Normal heart sounds. No murmur heard.    No friction rub.  Pulmonary:     Effort: Pulmonary effort is normal.     Breath sounds: Normal breath sounds. No wheezing, rhonchi or rales.  Abdominal:     General: Bowel sounds are normal.     Palpations: Abdomen is soft.     Tenderness: There is no abdominal tenderness.  Musculoskeletal:        General: No deformity. Normal range of motion.  Lymphadenopathy:     Cervical: No cervical adenopathy.  Skin:    General: Skin is warm and dry.     Findings: No lesion.  Neurological:     General: No focal deficit present.     Mental Status: She is alert and oriented to person, place, and time.  Psychiatric:        Mood and Affect: Mood normal.        Thought Content: Thought content normal.        05/10/2023    9:33 AM 03/15/2018    2:33 PM  Depression screen PHQ 2/9  Decreased Interest 0 0  Down, Depressed, Hopeless 0 0  PHQ - 2 Score 0 0  Altered sleeping 1   Tired, decreased  energy 0   Change in appetite 1   Feeling bad or failure about yourself  0   Trouble concentrating 0   Moving slowly or fidgety/restless 0   Suicidal thoughts 0   PHQ-9 Score 2   Difficult doing work/chores Not difficult at all       05/10/2023    9:33 AM  GAD 7 : Generalized Anxiety Score  Nervous, Anxious, on Edge 1  Control/stop worrying 1  Worry too much - different things 0  Trouble relaxing 0  Restless 0  Easily annoyed or irritable 0  Afraid - awful might happen 0  Total GAD 7 Score 2  Anxiety Difficulty Not difficult at all     No results found for any visits on 05/17/23.    Assessment & Plan:   Well adult exam -     CBC with Differential/Platelet; Future -     Comprehensive metabolic panel with GFR; Future -     Hemoglobin A1c; Future -     Lipid panel; Future -     T4, free; Future -     TSH; Future  Class 3 severe obesity with body mass index (BMI) of 40.0 to 44.9 in adult, unspecified obesity type, unspecified whether serious comorbidity present -Body mass index is 43.12 kg/m. -     Hemoglobin A1c; Future -     Lipid panel; Future -     T4, free; Future -     TSH; Future -     VITAMIN D  25 Hydroxy (Vit-D Deficiency, Fractures); Future  Need for hepatitis C screening test -     Hepatitis C antibody  Routine screening for STI (sexually transmitted infection) -     HIV Antibody (routine testing w rflx) -     RPR -     C. trachomatis/N. gonorrhoeae RNA  S/P gastric sleeve procedure -     VITAMIN D  25 Hydroxy (Vit-D Deficiency, Fractures); Future -     Vitamin B12  Age-appropriate health screenings discussed.  Immunizations reviewed.  Pap up-to-date done 12/29/2022.  Colonoscopy and mammogram not yet indicated.  Return if symptoms worsen or fail to improve.   Viola Greulich, MD

## 2023-05-18 LAB — C. TRACHOMATIS/N. GONORRHOEAE RNA
C. trachomatis RNA, TMA: NOT DETECTED
N. gonorrhoeae RNA, TMA: NOT DETECTED

## 2023-05-18 LAB — HIV ANTIBODY (ROUTINE TESTING W REFLEX): HIV 1&2 Ab, 4th Generation: NONREACTIVE

## 2023-05-18 LAB — RPR: RPR Ser Ql: NONREACTIVE

## 2023-05-18 LAB — HEPATITIS C ANTIBODY: Hepatitis C Ab: NONREACTIVE

## 2023-05-19 ENCOUNTER — Ambulatory Visit: Payer: Self-pay | Admitting: Family Medicine

## 2023-07-21 ENCOUNTER — Encounter (HOSPITAL_COMMUNITY): Payer: Self-pay | Admitting: *Deleted

## 2023-09-06 ENCOUNTER — Encounter: Payer: Self-pay | Admitting: Family Medicine

## 2023-09-08 ENCOUNTER — Ambulatory Visit: Admitting: Family Medicine

## 2023-12-07 ENCOUNTER — Ambulatory Visit
Admission: EM | Admit: 2023-12-07 | Discharge: 2023-12-07 | Disposition: A | Discharge status: Home / Self Care | Attending: Family Medicine | Admitting: Family Medicine

## 2023-12-07 DIAGNOSIS — R051 Acute cough: Secondary | ICD-10-CM

## 2023-12-07 DIAGNOSIS — B349 Viral infection, unspecified: Secondary | ICD-10-CM | POA: Diagnosis not present

## 2023-12-07 LAB — POC SOFIA SARS ANTIGEN FIA: SARS Coronavirus 2 Ag: NEGATIVE

## 2023-12-07 NOTE — Discharge Instructions (Addendum)
 You tested negative for COVID.  Please treat your symptoms with over the counter cough medication, tylenol or ibuprofen, humidifier, and rest. Viral illnesses can last 7-14 days. Please follow up with your PCP if your symptoms are not improving. Please go to the ER for any worsening symptoms. This includes but is not limited to fever you can not control with tylenol or ibuprofen, you are not able to stay hydrated, you have shortness of breath or chest pain.  Thank you for choosing [facility] for your healthcare needs. I hope you feel better soon!

## 2023-12-07 NOTE — ED Provider Notes (Signed)
 [facility]    CSN: [id number] Arrival date & time: 12/07/23  1612      History   Chief Complaint Chief Complaint  Patient presents with   Sore Throat    HPI Amanda Ruiz is a 38 y.o. female  presents for evaluation of URI symptoms for 4 days. Patient reports associated symptoms of cough, congestion, sore throat, chills, headache. Denies N/V/D, pain, body aches, shortness of breath. Patient does not have a hx of asthma. Patient is not an active smoker.   Reports  sick contacts via friend.  Pt has taken cold medicine OTC for symptoms. Pt has no other concerns at this time.    Sore Throat Associated symptoms include headaches.    Past Medical History:  Diagnosis Date   Anxiety    Depression    GERD (gastroesophageal reflux disease)    Incomplete right bundle branch block (RBBB) 03/16/2018   noted on EKG   Morbid obesity (HCC)    Muscle spasm    takes Magnesium   Sciatica    Left   Vertigo     Patient Active Problem List   Diagnosis Date Noted   Obesity 06/28/2013    Past Surgical History:  Procedure Laterality Date   BREATH TEK H PYLORI N/A 07/29/2013   Procedure: BREATH TEK H PYLORI;  Surgeon: Donnice KATHEE Lunger, MD;  Location: [facility];  Service: General;  Laterality: N/A;   LAPAROSCOPIC GASTRIC SLEEVE RESECTION N/A 12/18/2018   Procedure: LAPAROSCOPIC GASTRIC SLEEVE RESECTION, UPPER ENDOSCOPY, ERAS PATHWAY;  Surgeon: Kinsinger, Herlene Righter, MD;  Location: [facility];  Service: General;  Laterality: N/A;    OB History   No obstetric history on file.      Home Medications    Prior to Admission medications   Medication Sig Start Date End Date Taking? Authorizing Provider  cetirizine  (ZYRTEC  ALLERGY) 10 MG tablet Take 1 tablet (10 mg total) by mouth daily. 10/28/22   Christopher Savannah, PA-C  cholecalciferol (VITAMIN D3) 25 MCG (1000 UNIT) tablet Take 1,000 Units by mouth daily.    [provider]  Magnesium 250 MG TABS Take by mouth.     [provider]  Multiple Vitamin (MULTIVITAMIN WITH MINERALS) TABS tablet Take 1 tablet by mouth daily.    [provider]  [name] 3-0.02 MG tablet     [provider]  Probiotic Product (PROBIOTIC BLEND PO) Take by mouth.    [provider]    Family History Family History  Problem Relation Age of Onset   Hypertension Other    Hypertension Mother    Obesity Mother    Stroke Mother    Anxiety disorder Brother    Hypertension Brother    Obesity Brother     Social History Social History   Tobacco Use   Smoking status: Never   Smokeless tobacco: Never  Vaping Use   Vaping status: Never Used  Substance Use Topics   Alcohol use: Yes    Comment: occasionally   Drug use: Yes    Types: Marijuana     Allergies   Patient has no known allergies.   Review of Systems Review of Systems  Constitutional:  Positive for chills.  HENT:  Positive for congestion and sore throat.   Respiratory:  Positive for cough.   Neurological:  Positive for headaches.     Physical Exam Triage Vital Signs ED Triage Vitals  Encounter Vitals Group     BP 12/07/23 1644 125/81  Girls Systolic BP Percentile --      Girls Diastolic BP Percentile --      Boys Systolic BP Percentile --      Boys Diastolic BP Percentile --      Pulse Rate 12/07/23 1643 79     Resp 12/07/23 1643 17     Temp 12/07/23 1644 98.3 F (36.8 C)     Temp Source 12/07/23 1644 Oral     SpO2 12/07/23 1643 97 %     Weight --      Height --      Head Circumference --      Peak Flow --      Pain Score 12/07/23 1642 4     Pain Loc --      Pain Education --      Exclude from Growth Chart --    No data found.  Updated Vital Signs BP 125/81   Pulse 79   Temp 98.3 F (36.8 C) (Oral)   Resp 17   LMP 12/02/2023 (Exact Date)   SpO2 97%   Visual Acuity Right Eye Distance:   Left Eye Distance:   Bilateral Distance:    Right Eye Near:   Left Eye Near:    Bilateral Near:      Physical Exam Vitals and nursing note reviewed.  Constitutional:      General: She is not in acute distress.    Appearance: She is well-developed. She is not ill-appearing.  HENT:     Head: Normocephalic and atraumatic.     Right Ear: Tympanic membrane and ear canal normal.     Left Ear: Tympanic membrane and ear canal normal.     Nose: Congestion present.     Mouth/Throat:     Mouth: Mucous membranes are moist.     Pharynx: Oropharynx is clear. Uvula midline. Posterior oropharyngeal erythema present.     Tonsils: No tonsillar exudate or tonsillar abscesses.  Eyes:     Conjunctiva/sclera: Conjunctivae normal.     Pupils: Pupils are equal, round, and reactive to light.  Cardiovascular:     Rate and Rhythm: Normal rate and regular rhythm.     Heart sounds: Normal heart sounds.  Pulmonary:     Effort: Pulmonary effort is normal.     Breath sounds: Normal breath sounds.  Musculoskeletal:     Cervical back: Normal range of motion and neck supple.  Lymphadenopathy:     Cervical: No cervical adenopathy.  Skin:    General: Skin is warm and dry.  Neurological:     General: No focal deficit present.     Mental Status: She is alert and oriented to person, place, and time.  Psychiatric:        Mood and Affect: Mood normal.        Behavior: Behavior normal.      UC Treatments / Results  Labs (all labs ordered are listed, but only abnormal results are displayed) Labs Reviewed  POC SOFIA SARS ANTIGEN FIA    EKG   Radiology No results found.  Procedures Procedures (including critical care time)  Medications Ordered in UC Medications - No data to display  Initial Impression / Assessment and Plan / UC Course  I have reviewed the triage vital signs and the nursing notes.  Pertinent labs & imaging results that were available during my care of the patient were reviewed by me and considered in my medical decision making (see chart for details).     Reviewed  exam and  symptoms with patient.  No red flags.  Negative COVID testing.  Discussed viral illness and symptomatic treatment.  PCP follow-up if symptoms do not improve.  ER precautions reviewed. Final Clinical Impressions(s) / UC Diagnoses   Final diagnoses:  Acute cough  Viral illness     Discharge Instructions      You tested negative for COVID.  Please treat your symptoms with over the counter cough medication, tylenol  or ibuprofen, humidifier, and rest. Viral illnesses can last 7-14 days. Please follow up with your PCP if your symptoms are not improving. Please go to the ER for any worsening symptoms. This includes but is not limited to fever you can not control with tylenol  or ibuprofen, you are not able to stay hydrated, you have shortness of breath or chest pain.  Thank you for choosing [facility] for your healthcare needs. I hope you feel better soon!      ED Prescriptions   None    PDMP not reviewed this encounter.   Loreda Myla SAUNDERS, NP 12/07/23 [phone]

## 2023-12-07 NOTE — ED Triage Notes (Signed)
 Pt present with c/o sore throat x Monday. States on Tuesday she started having fever, chills and headaches. Pt had a negative COVID test at home. States the symptoms have subsided except for the sore throat and cough. States she travels soon.
